# Patient Record
Sex: Male | Born: 1985 | Race: Black or African American | Hispanic: No | Marital: Single | Smoking: Current every day smoker
Health system: Southern US, Community
[De-identification: ages and names within clinical notes are randomized; demographics above are authoritative.]

## PROBLEM LIST (undated history)

## (undated) DIAGNOSIS — F1010 Alcohol abuse, uncomplicated: Secondary | ICD-10-CM

## (undated) HISTORY — PX: APPENDECTOMY: SHX54

---
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
## 2004-07-17 ENCOUNTER — Emergency Department (HOSPITAL_COMMUNITY): Admission: EM | Admit: 2004-07-17 | Discharge: 2004-07-17 | Payer: Self-pay | Admitting: Emergency Medicine

## 2004-07-27 ENCOUNTER — Emergency Department (HOSPITAL_COMMUNITY): Admission: EM | Admit: 2004-07-27 | Discharge: 2004-07-27 | Payer: Self-pay | Admitting: Family Medicine

## 2004-12-24 ENCOUNTER — Emergency Department (HOSPITAL_COMMUNITY): Admission: EM | Admit: 2004-12-24 | Discharge: 2004-12-24 | Payer: Self-pay | Admitting: Emergency Medicine

## 2005-01-03 ENCOUNTER — Emergency Department (HOSPITAL_COMMUNITY): Admission: EM | Admit: 2005-01-03 | Discharge: 2005-01-03 | Payer: Self-pay | Admitting: Emergency Medicine

## 2005-06-30 ENCOUNTER — Emergency Department (HOSPITAL_COMMUNITY): Admission: EM | Admit: 2005-06-30 | Discharge: 2005-06-30 | Payer: Self-pay | Admitting: Emergency Medicine

## 2006-01-10 ENCOUNTER — Emergency Department (HOSPITAL_COMMUNITY): Admission: EM | Admit: 2006-01-10 | Discharge: 2006-01-11 | Payer: Self-pay | Admitting: Emergency Medicine

## 2006-02-06 ENCOUNTER — Emergency Department (HOSPITAL_COMMUNITY): Admission: EM | Admit: 2006-02-06 | Discharge: 2006-02-07 | Payer: Self-pay | Admitting: Emergency Medicine

## 2006-02-12 ENCOUNTER — Emergency Department (HOSPITAL_COMMUNITY): Admission: EM | Admit: 2006-02-12 | Discharge: 2006-02-12 | Payer: Self-pay | Admitting: Emergency Medicine

## 2006-09-04 ENCOUNTER — Emergency Department (HOSPITAL_COMMUNITY): Admission: EM | Admit: 2006-09-04 | Discharge: 2006-09-04 | Payer: Self-pay | Admitting: Emergency Medicine

## 2010-02-04 ENCOUNTER — Emergency Department (HOSPITAL_COMMUNITY): Admission: EM | Admit: 2010-02-04 | Discharge: 2010-02-04 | Payer: Self-pay | Admitting: Emergency Medicine

## 2010-06-30 ENCOUNTER — Emergency Department (HOSPITAL_COMMUNITY): Admission: EM | Admit: 2010-06-30 | Discharge: 2010-06-30 | Payer: Self-pay | Admitting: Emergency Medicine

## 2010-06-30 ENCOUNTER — Emergency Department (HOSPITAL_COMMUNITY): Admission: EM | Admit: 2010-06-30 | Discharge: 2010-06-30 | Payer: Self-pay | Admitting: Family Medicine

## 2010-12-19 LAB — URINALYSIS, ROUTINE W REFLEX MICROSCOPIC
Bilirubin Urine: NEGATIVE
Nitrite: NEGATIVE
Protein, ur: NEGATIVE mg/dL
Urobilinogen, UA: 1 mg/dL (ref 0.0–1.0)

## 2010-12-19 LAB — URINE MICROSCOPIC-ADD ON

## 2011-02-04 ENCOUNTER — Emergency Department (HOSPITAL_COMMUNITY)
Admission: EM | Admit: 2011-02-04 | Discharge: 2011-02-04 | Payer: Self-pay | Attending: Emergency Medicine | Admitting: Emergency Medicine

## 2011-02-04 DIAGNOSIS — IMO0002 Reserved for concepts with insufficient information to code with codable children: Secondary | ICD-10-CM | POA: Insufficient documentation

## 2011-02-04 DIAGNOSIS — S0990XA Unspecified injury of head, initial encounter: Secondary | ICD-10-CM | POA: Insufficient documentation

## 2011-02-04 DIAGNOSIS — R404 Transient alteration of awareness: Secondary | ICD-10-CM | POA: Insufficient documentation

## 2011-02-04 DIAGNOSIS — H5710 Ocular pain, unspecified eye: Secondary | ICD-10-CM | POA: Insufficient documentation

## 2011-02-04 DIAGNOSIS — H5789 Other specified disorders of eye and adnexa: Secondary | ICD-10-CM | POA: Insufficient documentation

## 2011-02-04 DIAGNOSIS — F1010 Alcohol abuse, uncomplicated: Secondary | ICD-10-CM | POA: Insufficient documentation

## 2011-02-04 DIAGNOSIS — M549 Dorsalgia, unspecified: Secondary | ICD-10-CM | POA: Insufficient documentation

## 2011-02-04 DIAGNOSIS — T2104XA Burn of unspecified degree of lower back, initial encounter: Secondary | ICD-10-CM | POA: Insufficient documentation

## 2011-02-04 DIAGNOSIS — M79609 Pain in unspecified limb: Secondary | ICD-10-CM | POA: Insufficient documentation

## 2013-11-07 ENCOUNTER — Encounter (HOSPITAL_COMMUNITY): Payer: Self-pay | Admitting: Emergency Medicine

## 2013-11-07 ENCOUNTER — Emergency Department (HOSPITAL_COMMUNITY)
Admission: EM | Admit: 2013-11-07 | Discharge: 2013-11-07 | Discharge status: Against Medical Advice | Payer: Self-pay | Attending: Emergency Medicine | Admitting: Emergency Medicine

## 2013-11-07 DIAGNOSIS — R252 Cramp and spasm: Secondary | ICD-10-CM | POA: Insufficient documentation

## 2013-11-07 DIAGNOSIS — F1010 Alcohol abuse, uncomplicated: Secondary | ICD-10-CM | POA: Insufficient documentation

## 2013-11-07 DIAGNOSIS — F17200 Nicotine dependence, unspecified, uncomplicated: Secondary | ICD-10-CM | POA: Insufficient documentation

## 2013-11-07 DIAGNOSIS — Y939 Activity, unspecified: Secondary | ICD-10-CM | POA: Insufficient documentation

## 2013-11-07 DIAGNOSIS — IMO0002 Reserved for concepts with insufficient information to code with codable children: Secondary | ICD-10-CM

## 2013-11-07 DIAGNOSIS — F10929 Alcohol use, unspecified with intoxication, unspecified: Secondary | ICD-10-CM

## 2013-11-07 DIAGNOSIS — Y929 Unspecified place or not applicable: Secondary | ICD-10-CM | POA: Insufficient documentation

## 2013-11-07 DIAGNOSIS — T4271XA Poisoning by unspecified antiepileptic and sedative-hypnotic drugs, accidental (unintentional), initial encounter: Principal | ICD-10-CM

## 2013-11-07 DIAGNOSIS — T426X1A Poisoning by other antiepileptic and sedative-hypnotic drugs, accidental (unintentional), initial encounter: Secondary | ICD-10-CM | POA: Insufficient documentation

## 2013-11-07 LAB — CBC
HCT: 39.9 % (ref 39.0–52.0)
HEMOGLOBIN: 14.5 g/dL (ref 13.0–17.0)
MCH: 34.3 pg — ABNORMAL HIGH (ref 26.0–34.0)
MCHC: 36.3 g/dL — ABNORMAL HIGH (ref 30.0–36.0)
MCV: 94.3 fL (ref 78.0–100.0)
PLATELETS: 169 10*3/uL (ref 150–400)
RBC: 4.23 MIL/uL (ref 4.22–5.81)
RDW: 14.1 % (ref 11.5–15.5)
WBC: 5.2 10*3/uL (ref 4.0–10.5)

## 2013-11-07 LAB — URINALYSIS, ROUTINE W REFLEX MICROSCOPIC
BILIRUBIN URINE: NEGATIVE
Glucose, UA: NEGATIVE mg/dL
Hgb urine dipstick: NEGATIVE
KETONES UR: NEGATIVE mg/dL
Leukocytes, UA: NEGATIVE
NITRITE: NEGATIVE
Protein, ur: NEGATIVE mg/dL
SPECIFIC GRAVITY, URINE: 1.003 — AB (ref 1.005–1.030)
Urobilinogen, UA: 0.2 mg/dL (ref 0.0–1.0)
pH: 7 (ref 5.0–8.0)

## 2013-11-07 LAB — CK: Total CK: 769 U/L — ABNORMAL HIGH (ref 7–232)

## 2013-11-07 LAB — COMPREHENSIVE METABOLIC PANEL
ALBUMIN: 4.3 g/dL (ref 3.5–5.2)
ALK PHOS: 50 U/L (ref 39–117)
ALT: 20 U/L (ref 0–53)
AST: 41 U/L — AB (ref 0–37)
BILIRUBIN TOTAL: 0.2 mg/dL — AB (ref 0.3–1.2)
BUN: 6 mg/dL (ref 6–23)
CALCIUM: 9.1 mg/dL (ref 8.4–10.5)
CO2: 26 mEq/L (ref 19–32)
Chloride: 100 mEq/L (ref 96–112)
Creatinine, Ser: 0.88 mg/dL (ref 0.50–1.35)
GFR calc Af Amer: >90 mL/min (ref >90)
Glucose, Bld: 87 mg/dL (ref 70–99)
POTASSIUM: 4 meq/L (ref 3.7–5.3)
Sodium: 143 mEq/L (ref 137–147)
Total Protein: 7.9 g/dL (ref 6.0–8.3)

## 2013-11-07 LAB — RAPID URINE DRUG SCREEN, HOSP PERFORMED
AMPHETAMINES: NOT DETECTED
BARBITURATES: NOT DETECTED
BENZODIAZEPINES: NOT DETECTED
COCAINE: NOT DETECTED
Opiates: NOT DETECTED
TETRAHYDROCANNABINOL: NOT DETECTED

## 2013-11-07 LAB — ACETAMINOPHEN LEVEL

## 2013-11-07 LAB — ETHANOL: ALCOHOL ETHYL (B): 256 mg/dL — AB (ref 0–11)

## 2013-11-07 LAB — SALICYLATE LEVEL: Salicylate Lvl: <2 mg/dL — ABNORMAL LOW (ref 2.8–20.0)

## 2013-11-07 MED ORDER — SODIUM CHLORIDE 0.9 % IV BOLUS (SEPSIS)
1000.0000 mL | Freq: Once | INTRAVENOUS | Status: AC
  Administered 2013-11-07: 1000 mL via INTRAVENOUS

## 2013-11-07 MED ORDER — DIPHENHYDRAMINE HCL 50 MG/ML IJ SOLN
25.0000 mg | Freq: Once | INTRAMUSCULAR | Status: AC
  Administered 2013-11-07: 25 mg via INTRAVENOUS
  Filled 2013-11-07: qty 1

## 2013-11-07 NOTE — ED Notes (Signed)
Masneri went in to see pt and discharge pt, pt was no longer in the room. IV had not been disconnected. [facility]

## 2013-11-07 NOTE — ED Notes (Signed)
Per EMS- pt has been taking phenergan that he found on the street for 3 days (4 on Thursday, 5-6 Friday and 0 today) as well as 4 40oz beers a day since thursday and 1 today. Pt states that he did not know what the pill were for but decided to take them. Denies SI. Pt is having difficulty walking and using hands. States "my arms and legs are locking up on me". States "when i sit in a chair my neck leans back and locks up on me"
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
## 2013-11-07 NOTE — ED Provider Notes (Signed)
CSN: [id number]     Arrival date & time 11/07/13  1757 History   First MD Initiated Contact with Patient 11/07/13 1807     Chief Complaint  Patient presents with  . Drug Overdose   (Consider location/radiation/quality/duration/timing/severity/associated sxs/prior Treatment) HPI Comments: 28 yo AA male presents to ER via EMS with cc of muscle cramping.    Pt started drinking EtOH 3 days ago and was taking phenergan intermittently.  He found the phenergan on the street.  He threw them away after the symptoms began. Total of 4 on Thurs, 5-6 on Fri, and none today.  He states he is now cramping when he walks.    He denies MJ, Cocaine, heroin, or other Rx meds.  Just EtOH and phenergan.  "I just get my drink on and I took those pills".    Pt denies HA, CP, cough, fever, chills, n/v/d, f/u/d, neurologic deficits.    Patient is a 27 y.o. male presenting with Overdose. The history is provided by the patient and the EMS personnel.  Drug Overdose This is a new problem. The current episode started 2 days ago. The problem occurs constantly. The problem has been gradually improving. Pertinent negatives include no chest pain, no abdominal pain, no headaches and no shortness of breath. Nothing aggravates the symptoms. Nothing relieves the symptoms. He has tried nothing for the symptoms.    History reviewed. No pertinent past medical history. History reviewed. No pertinent past surgical history. History reviewed. No pertinent family history. History  Substance Use Topics  . Smoking status: Current Every Day Smoker -- 0.50 packs/day    Types: Cigarettes  . Smokeless tobacco: Not on file  . Alcohol Use: 1.8 oz/week    3 Cans of beer per week    Review of Systems  Constitutional: Negative.   HENT: Negative.   Eyes: Negative.   Respiratory: Negative.  Negative for cough, choking, chest tightness and shortness of breath.   Cardiovascular: Negative for chest pain.  Gastrointestinal: Negative.   Negative for nausea, abdominal pain, diarrhea, constipation, anal bleeding and rectal pain.  Endocrine: Negative.   Genitourinary: Negative.   Musculoskeletal: Positive for myalgias. Negative for arthralgias, back pain, gait problem, joint swelling, neck pain and neck stiffness.       Muscle cramping  Allergic/Immunologic: Negative.   Neurological: Negative for tremors, seizures, syncope, speech difficulty, light-headedness, numbness and headaches.  Psychiatric/Behavioral: Negative.     Allergies  Review of patient's allergies indicates no known allergies.  Home Medications  No current outpatient prescriptions on file. BP 131/77  Pulse 79  Temp(Src) 97.9 F (36.6 C) (Oral)  Resp 12  SpO2 98% Physical Exam  Nursing note and vitals reviewed. Constitutional: He is oriented to person, place, and time. He appears well-developed and well-nourished. No distress.  HENT:  Head: Normocephalic and atraumatic.  Nose: Nose normal.  Mouth/Throat: Oropharynx is clear and moist. No oropharyngeal exudate.  Eyes: Conjunctivae and EOM are normal. Right eye exhibits no discharge. Left eye exhibits no discharge.  Neck: Normal range of motion. Neck supple. No JVD present.  Cardiovascular: Normal rate and regular rhythm.  Exam reveals no friction rub.   No murmur heard. Pulmonary/Chest: Effort normal and breath sounds normal. No stridor. No respiratory distress. He has no wheezes. He has no rales. He exhibits no tenderness.  Abdominal: Soft. Bowel sounds are normal. There is no tenderness. There is no rebound and no guarding.  Musculoskeletal: Normal range of motion. He exhibits no edema and no tenderness.  Neurological: He is alert and oriented to person, place, and time. He has normal reflexes.  Skin: Skin is warm and dry. He is not diaphoretic.    ED Course  Procedures (including critical care time) Labs Review Labs Reviewed  CBC - Abnormal; Notable for the following:    MCH 34.3 (*)    MCHC  36.3 (*)    All other components within normal limits  COMPREHENSIVE METABOLIC PANEL - Abnormal; Notable for the following:    AST 41 (*)    Total Bilirubin 0.2 (*)    All other components within normal limits  CK - Abnormal; Notable for the following:    Total CK 769 (*)    All other components within normal limits  URINALYSIS, ROUTINE W REFLEX MICROSCOPIC - Abnormal; Notable for the following:    Specific Gravity, Urine 1.003 (*)    All other components within normal limits  SALICYLATE LEVEL - Abnormal; Notable for the following:    Salicylate Lvl <2.0 (*)    All other components within normal limits  ETHANOL - Abnormal; Notable for the following:    Alcohol, Ethyl (B) 256 (*)    All other components within normal limits  URINE RAPID DRUG SCREEN (HOSP PERFORMED)  ACETAMINOPHEN LEVEL   Imaging Review No results found.  EKG Interpretation    Date/Time:  Saturday November 07 2013 18:03:28 EST Ventricular Rate:  90 PR Interval:  145 QRS Duration: 99 QT Interval:  353 QTC Calculation: 432 R Axis:   -59 Text Interpretation:  Sinus rhythm Left axis deviation ST elev, probable normal early repol pattern Baseline wander in lead(s) V3 [facility] by [facility]  [facility], DAVID ([id number]) on 11/07/2013 6:08:49 PM            MDM   1. Drug ingestion   2. Alcohol intoxication    28 yo AA male presents with cc of muscle cramping after taking approx 9 tablets of phenergan 25mg  over past 2 days and drinking EtOH.    Plan for labwork, IVF hydration and monitoring.  Will give IV benadryl as well.    Prolonged ER stay. Patient feeling much better. No ectopy on the monitor. No issues during ER stay.  Patient did have an elevated CK at 769. He did receive IV fluids. His renal function and liver function tests were predominantly normal. UDS negative. Ethanol 256.  At approximately 2200 pt requesting to go home. Nursing informed him that I needed to come back and reassess him. He did ambulate  throughout ER without issues. He did have family members at the bedside. Currently patient left prior to formal discharge instructions. Presumably he took out his own IV. He was alert and oriented in no acute distress. Vital signs were stable. He was symptom free.  Darlys Galesavid Masneri, MD 11/07/13 2236

## 2014-05-09 ENCOUNTER — Encounter (HOSPITAL_COMMUNITY): Payer: Self-pay | Admitting: Emergency Medicine

## 2014-05-09 ENCOUNTER — Emergency Department (HOSPITAL_COMMUNITY)
Admission: EM | Admit: 2014-05-09 | Discharge: 2014-05-09 | Disposition: A | Discharge status: Home / Self Care | Payer: Self-pay | Attending: Emergency Medicine | Admitting: Emergency Medicine

## 2014-05-09 ENCOUNTER — Emergency Department (HOSPITAL_COMMUNITY): Payer: Self-pay

## 2014-05-09 DIAGNOSIS — S0990XA Unspecified injury of head, initial encounter: Secondary | ICD-10-CM | POA: Insufficient documentation

## 2014-05-09 DIAGNOSIS — R000 Tachycardia, unspecified: Secondary | ICD-10-CM | POA: Insufficient documentation

## 2014-05-09 DIAGNOSIS — T07XXXA Unspecified multiple injuries, initial encounter: Secondary | ICD-10-CM | POA: Insufficient documentation

## 2014-05-09 DIAGNOSIS — IMO0002 Reserved for concepts with insufficient information to code with codable children: Secondary | ICD-10-CM | POA: Insufficient documentation

## 2014-05-09 DIAGNOSIS — S022XXA Fracture of nasal bones, initial encounter for closed fracture: Secondary | ICD-10-CM | POA: Insufficient documentation

## 2014-05-09 DIAGNOSIS — F17200 Nicotine dependence, unspecified, uncomplicated: Secondary | ICD-10-CM | POA: Insufficient documentation

## 2014-05-09 LAB — BASIC METABOLIC PANEL
Anion gap: 29 — ABNORMAL HIGH (ref 5–15)
BUN: 8 mg/dL (ref 6–23)
CALCIUM: 9.2 mg/dL (ref 8.4–10.5)
CO2: 13 mEq/L — ABNORMAL LOW (ref 19–32)
CREATININE: 1.21 mg/dL (ref 0.50–1.35)
Chloride: 101 mEq/L (ref 96–112)
GFR calc non Af Amer: 80 mL/min — ABNORMAL LOW (ref >90)
Glucose, Bld: 91 mg/dL (ref 70–99)
Potassium: 3.9 mEq/L (ref 3.7–5.3)
Sodium: 143 mEq/L (ref 137–147)

## 2014-05-09 LAB — CBC
HCT: 46.8 % (ref 39.0–52.0)
Hemoglobin: 15.8 g/dL (ref 13.0–17.0)
MCH: 31.7 pg (ref 26.0–34.0)
MCHC: 33.8 g/dL (ref 30.0–36.0)
MCV: 94 fL (ref 78.0–100.0)
PLATELETS: 170 10*3/uL (ref 150–400)
RBC: 4.98 MIL/uL (ref 4.22–5.81)
RDW: 15.4 % (ref 11.5–15.5)
WBC: 12.1 10*3/uL — AB (ref 4.0–10.5)

## 2014-05-09 LAB — ETHANOL: Alcohol, Ethyl (B): 75 mg/dL — ABNORMAL HIGH (ref 0–11)

## 2014-05-09 MED ORDER — NAPROXEN 500 MG PO TABS: 500.0000 mg | ORAL_TABLET | Freq: Two times a day (BID) | ORAL | Status: DC

## 2014-05-09 MED ORDER — FENTANYL CITRATE 0.05 MG/ML IJ SOLN
100.0000 ug | Freq: Once | INTRAMUSCULAR | Status: AC
  Administered 2014-05-09: 100 ug via INTRAVENOUS
  Filled 2014-05-09: qty 2

## 2014-05-09 MED ORDER — SODIUM CHLORIDE 0.9 % IV BOLUS (SEPSIS)
1000.0000 mL | Freq: Once | INTRAVENOUS | Status: AC
  Administered 2014-05-09: 1000 mL via INTRAVENOUS

## 2014-05-09 MED ORDER — HYDROMORPHONE HCL PF 1 MG/ML IJ SOLN
1.0000 mg | Freq: Once | INTRAMUSCULAR | Status: AC
  Administered 2014-05-09: 1 mg via INTRAVENOUS
  Filled 2014-05-09: qty 1

## 2014-05-09 MED ORDER — SODIUM CHLORIDE 0.9 % IV BOLUS (SEPSIS): 1000.0000 mL | Freq: Once | INTRAVENOUS | Status: DC

## 2014-05-09 MED ORDER — HYDROCODONE-ACETAMINOPHEN 5-325 MG PO TABS
2.0000 | ORAL_TABLET | ORAL | Status: DC | PRN
Start: 2014-05-09 — End: 2014-08-11
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
## 2014-05-09 NOTE — Discharge Instructions (Signed)
°Emergency Department Resource Guide °1) Find a Doctor and Pay Out of Pocket °Although you won't have to find out who is covered by your insurance plan, it is a good idea to ask around and get recommendations. You will then need to call the office and see if the doctor you have chosen will accept you as a new patient and what types of options they offer for patients who are self-pay. Some doctors offer discounts or will set up payment plans for their patients who do not have insurance, but you will need to ask so you aren't surprised when you get to your appointment. ° °2) Contact Your Local Health Department °Not all health departments have doctors that can see patients for sick visits, but many do, so it is worth a call to see if yours does. If you don't know where your local health department is, you can check in your phone book. The CDC also has a tool to help you locate your state's health department, and many state websites also have listings of all of their local health departments. ° °3) Find a Walk-in Clinic °If your illness is not likely to be very severe or complicated, you may want to try a walk in clinic. These are popping up all over the country in pharmacies, drugstores, and shopping centers. They're usually staffed by nurse practitioners or physician assistants that have been trained to treat common illnesses and complaints. They're usually fairly quick and inexpensive. However, if you have serious medical issues or chronic medical problems, these are probably not your best option. ° °No Primary Care Doctor: °- Call [facility] at  832-8000 - they can help you locate a primary care doctor that  accepts your insurance, provides certain services, etc. °- Physician Referral Service- 1-800-533-3463 ° °Chronic Pain Problems: °Organization         Address  Phone   Notes  °[facility]  (336) 297-2271 Patients need to be referred by their primary care doctor.  ° °Medication  Assistance: °Organization         Address  Phone   Notes  °[place] [facility] [address] °[place], [place] [address] (336) 641-8030 --Must be a resident of [place] °-- Must have NO insurance coverage whatsoever (no Medicaid/ Medicare, etc.) °-- The pt. MUST have a primary care doctor that directs their care regularly and follows them in the community °  °[facility]  (866) 331-1348   °[employer]  (888) 892-1162   ° °Agencies that provide inexpensive medical care: °Organization         Address  Phone   Notes  °[facility]  (336) 832-8035   °[facility]    (336) 832-7272   °[facility] [address] °[place], [place] [address] (336) 832-4777   °[facility] [address], °[place] (336) 271-4999   °[facility]    (336) 373-0678   °[facility]    (336) 272-1050   °[facility] ° [address], [place] Phone:  (336) 832-4444, Fax:  (336) 832-4440 Hours of Operation:  9 am - 6 pm, M-F.  Also accepts Medicaid/Medicare and self-pay.  °[facility] ° [address], [place] Phone: (336) 832-3150, Fax: (336) 832-3151. Hours of Operation:  8:30 am - 5:30 pm, M-F.  Also accepts Medicaid and self-pay.  °[facility] [place] [address], [place] Phone: (336) 878-6027   °[facility] [address], [place], [place] (336)723-1848, Ext. 123 Mondays & Thursdays: 7-9 AM.  First 15 patients are seen on a first come, first serve basis. °  ° °Medicaid-accepting [place] Providers: ° °Organization         Address  Phone   Notes  °[facility] [address], [place] (336) 641-2100 Also accepts self-pay patients.  °[facility] [address], [place] ° (336) 856-9996   °[facility] [address], [place]  (336) 288-8857   °[facility] [address] [place] [address], [place] (336) 299-7000   °Veita Bland [address], [place]  ° (336) 373-1557 Only accepts [place] Access Medicaid patients after they have their name applied to their card.  ° °Self-Pay (no insurance) in [place]: ° °Organization         Address  Phone   Notes  °Sickle Cell Patients, [facility] [address], [place] (336) 832-1970   °[facility] [address], [place] (336) 832-4400   °[facility] [place] ° [address], [place] (336) 992-4800   °[facility]/Dr. Osei-Bonsu ° [address] [place] [address], [place] or [address], [place] (336) 841-8500 Phone number for both [place] and [place] locations is the same.  °[facility] [address], [place] (336) 299-0000   °[facility] [address] [place] [address], [place] or [address] (336) 852-7530 °(336) 878-2260   °[facility] [address], [place] (336) 350-1642, phone; (336) 294-5005, fax Sees patients 1st and 3rd Saturday of every month.  Must not qualify for public or private insurance (i.e. Medicaid, Medicare, [place] Health Choice, Veterans' Benefits) • Household income should be no more than 200% of the poverty level •The clinic cannot treat you if you are pregnant or think you are pregnant • Sexually transmitted diseases are not treated at the clinic.  ° ° °Dental Care: °Organization         Address  Phone  Notes  °[place] [facility] [address], [place] (336) 641-6152 Accepts children up to age 21 who are enrolled in Medicaid or [place] Health Choice; pregnant women with a Medicaid card; and children who have applied for Medicaid or [place] Health Choice, but were declined, whose parents can pay a reduced fee at time of service.  °[place]  [facility] [place]  [address], [place] (336) 641-7733 Accepts children up to age 21 who are enrolled in Medicaid or [place] Health Choice; pregnant women with a Medicaid card; and children who have applied for Medicaid or [place] Health Choice, but were declined, whose parents can pay a reduced fee at time of service.  °[facility] ° [address],  (336) 641-4533 Patients are seen by appointment only. Walk-ins are not accepted. [facility] will see patients 18 years of age and older. °Monday - Tuesday (8am-5pm) °Most Wednesdays (8:30-5pm) °$30 per visit, cash only  °[facility] ° [address], [place] (336) 641-4533 Patients are seen by appointment only. Walk-ins are not accepted. [facility] will see patients 18 years of age and older. °One   Wednesday Evening (Monthly: Volunteer Based).  $30 per visit, cash only  °[facility]  (919) 537-3737 for adults; Children under age 4, call Graduate Pediatric Dentistry at (919) [phone]. Children aged 4-14, please call (919) [phone] to request a pediatric application. ° Dental services are provided in all areas of dental care including fillings, crowns and bridges, complete and partial dentures, implants, gum treatment, root canals, and extractions. Preventive care is also provided. Treatment is provided to both adults and children. °Patients are selected via a lottery and there is often a waiting list. °  °[facility] [address], °[place] ° (336) 763-8833 [email or web address] °  °[facility] [address], [place], [place] (336)723-1848, Ext. 123 Second and Fourth Thursday of each month, opens at 6:30 AM; Clinic ends at 9 AM.  Patients are seen on a first-come first-served basis, and a limited number are seen during each clinic.  ° °[facility] ° [address], [place], [place] (336) 723-7904    Eligibility Requirements °You must have lived in [place], [place], or [place] counties for at least the last three months. °  You cannot be eligible for state or federal sponsored healthcare insurance, including Veterans Administration, Medicaid, or Medicare. °  You generally cannot be eligible for healthcare insurance through your employer.  °  How to apply: °Eligibility screenings are held every Tuesday and Wednesday afternoon from 1:00 pm until 4:00 pm. You do not need an appointment for the interview!  °[facility] [address], [place], [place] 336-631-2330   °[facility] [facility]  336-342-8273   °[facility]  336-703-3100   °[facility]  336-570-6415   ° °Behavioral Health Resources in the Community: °Intensive Outpatient Programs °Organization         Address  Phone  Notes  °[facility] [address], [place], [place] 336-878-6098   °[facility] [address], [place], [place] 336-832-9800   °[facility] [address], [place], [place] ° 336-882-2125   °[facility] [address],  °[place], [place] 1-800-853-5163 or 336-641-4981   °Substance Abuse Resources °Organization         Address  Phone  Notes  °[facility]  336-882-2125   °[facility]  336-784-9470   °[facility]  336-285-9073   °[facility]  336-845-3988   °Residential & Outpatient Substance Abuse Program  1-800-659-3381   °Psychological Services °Organization         Address  Phone  Notes  °[facility]  336- 832-9600   °[facility]  336- 378-7881   °[facility] [address], [place] 1-800-853-5163 or 336-641-4981   ° °Mobile Crisis Teams °Organization         Address  Phone  Notes  °[facility], Mobile Crisis Care Unit  1-877-626-1772   °Assertive °[facility] ° [address]  [place], [place] 336-834-9664   °Sharon DeEsch [address] °[place] 336-554-5454   ° °Self-Help/Support Groups °Organization         Address  Phone             Notes  °[facility]  - variety of support groups  336- 373-1402 Call for more information  °Narcotics Anonymous (NA), [facility] [address], °[place]  2 meetings at this location  ° °  Residential Treatment Programs °Organization         Address  Phone  Notes  °[facility] [address],    °[place]  1-866-801-8205   °[facility] ° [address], [place], [place] 704-293-8524   °[facility] [address], [place] 336-845-3988 Admissions: 8am-3pm M-F  °[facility] [address]    °[place], [place] 336-841-1104   °[facility] [address], [place], [place] 336-379-7146   °[facility] [address]  °[place], [place] 336-285-9073   °[facility] - Intensive Outpatient [address], [place], [place] 336-852-3033   °[facility] [address]  °[place], [place] 1-877-615-2722 or 336-784-9470   °[facility] [address] [place], [place] 336-227-7417 Accepts Medicaid  °[facility] [address]  °[place] 1-800-659-3381 Substance Abuse/Addiction Treatment  ° °[place] Behavioral Health Resources °Organization         Address  Phone  Notes  °[facility]  (888) 581-9988   °Julie Brannon, PhD [address] [place], [place]   (336) 349-5553 or (336) 951-0000   °[facility]   [address] °[place], [place] (336) 349-4454   °[facility] [address], [place], [place] (336) 342-8316 Insurance/Medicaid/sponsorship through [facility]  °[facility] [address]                                    [place], [place] (336) 342-8316 Therapy/tele-psych/case    °[facility] [address]  ° [place], [place] (336) 349-2233    °Dr. Arfeen  (336) 349-4544   °[facility] [place]  [employer] [place] [facility] 1) [address], [place] °2) [address], [place] °3)  [address], [place] (336) 349-3220 °(336) 342-7768 ° °(336) 342-8140   °[place] [facility] (336) 342-1394 or (336) 342-3537 (After Hours)    ° ° °

## 2014-08-11 ENCOUNTER — Encounter (HOSPITAL_COMMUNITY): Payer: Self-pay | Admitting: Emergency Medicine

## 2014-08-11 ENCOUNTER — Emergency Department (HOSPITAL_COMMUNITY)
Admission: EM | Admit: 2014-08-11 | Discharge: 2014-08-11 | Disposition: A | Discharge status: Home / Self Care | Payer: Self-pay | Attending: Emergency Medicine | Admitting: Emergency Medicine

## 2014-08-11 DIAGNOSIS — K029 Dental caries, unspecified: Secondary | ICD-10-CM | POA: Insufficient documentation

## 2014-08-11 DIAGNOSIS — K088 Other specified disorders of teeth and supporting structures: Secondary | ICD-10-CM | POA: Insufficient documentation

## 2014-08-11 DIAGNOSIS — Z720 Tobacco use: Secondary | ICD-10-CM | POA: Insufficient documentation

## 2014-08-11 DIAGNOSIS — K0889 Other specified disorders of teeth and supporting structures: Secondary | ICD-10-CM

## 2014-08-11 MED ORDER — NAPROXEN 500 MG PO TABS: 500.0000 mg | ORAL_TABLET | Freq: Two times a day (BID) | ORAL | Status: DC

## 2014-08-11 MED ORDER — HYDROCODONE-ACETAMINOPHEN 5-325 MG PO TABS: 2.0000 | ORAL_TABLET | ORAL | Status: DC | PRN

## 2014-08-11 MED ORDER — KETOROLAC TROMETHAMINE 60 MG/2ML IM SOLN
60.0000 mg | Freq: Once | INTRAMUSCULAR | Status: AC
  Administered 2014-08-11: 60 mg via INTRAMUSCULAR
  Filled 2014-08-11: qty 2

## 2014-08-11 MED ORDER — PENICILLIN V POTASSIUM 250 MG PO TABS: 500.0000 mg | ORAL_TABLET | Freq: Four times a day (QID) | ORAL | Status: DC

## 2014-08-11 NOTE — Discharge Instructions (Signed)

## 2014-08-11 NOTE — ED Notes (Signed)
Pt. reports left upper and lower molar pain for 2 weeks unrelieved by OTC medications .

## 2014-08-11 NOTE — ED Provider Notes (Signed)
CSN: [id number]     Arrival date & time 08/11/14  2118 History   First MD Initiated Contact with Patient 08/11/14 2129     No chief complaint on file.    (Consider location/radiation/quality/duration/timing/severity/associated sxs/prior Treatment) HPI Comments: Dental pain for sevferal months, worse last 2 weeks, no associated swelling or cp or f/cn/v.  OTC meds without relief.  Pain in L lower and upper rear molars.  The history is provided by the patient.    History reviewed. No pertinent past medical history. Past Surgical History  Procedure Laterality Date  . Appendectomy     No family history on file. History  Substance Use Topics  . Smoking status: Current Every Day Smoker -- 0.50 packs/day    Types: Cigarettes  . Smokeless tobacco: Not on file  . Alcohol Use: 1.8 oz/week    3 Cans of beer per week    Review of Systems  Constitutional: Negative for fever and chills.  HENT: Positive for dental problem. Negative for facial swelling, sore throat, trouble swallowing and voice change.        Toothache  Gastrointestinal: Negative for nausea and vomiting.      Allergies  Review of patient's allergies indicates no known allergies.  Home Medications   Prior to Admission medications   Medication Sig Start Date End Date Taking? Authorizing Provider  HYDROcodone-acetaminophen (NORCO/VICODIN) 5-325 MG per tablet Take 2 tablets by mouth every 4 (four) hours as needed. 08/11/14   Vida RollerBrian D Dicky Boer, MD  naproxen (NAPROSYN) 500 MG tablet Take 1 tablet (500 mg total) by mouth 2 (two) times daily with a meal. 08/11/14   Vida RollerBrian D Amore Grater, MD  penicillin v potassium (VEETID) 250 MG tablet Take 2 tablets (500 mg total) by mouth 4 (four) times daily. 08/11/14   Vida RollerBrian D Izaiah Tabb, MD   BP 128/80 mmHg  Pulse 91  Temp(Src) 97.8 F (36.6 C) (Oral)  Resp 16  Ht 6\' 1"  (1.854 m)  Wt 185 lb (83.915 kg)  BMI 24.41 kg/m2  SpO2 97% Physical Exam  Constitutional: He appears well-developed and  well-nourished. No distress.  HENT:  Head: Normocephalic and atraumatic.  Mouth/Throat: Oropharynx is clear and moist. No oropharyngeal exudate.  Dental Disease - rear upper and lower L molars with deep caries, no abscesses, no trismus  Eyes: Conjunctivae are normal. No scleral icterus.  Neck: Normal range of motion. Neck supple. No thyromegaly present.  No lad  Cardiovascular: Normal rate and regular rhythm.   Pulmonary/Chest: Effort normal and breath sounds normal.  Lymphadenopathy:    He has no cervical adenopathy.  Neurological: He is alert.  Skin: Skin is warm and dry. No rash noted. He is not diaphoretic.  Nursing note and vitals reviewed.   ED Course  Procedures (including critical care time) Labs Review Labs Reviewed - No data to display  Imaging Review No results found.    MDM   Final diagnoses:  Toothache    WELL APPEARING, NO LUDWIGS, DENTAL F/U  Meds given in ED:  Medications  ketorolac (TORADOL) injection 60 mg (not administered)    New Prescriptions   HYDROCODONE-ACETAMINOPHEN (NORCO/VICODIN) 5-325 MG PER TABLET    Take 2 tablets by mouth every 4 (four) hours as needed.   NAPROXEN (NAPROSYN) 500 MG TABLET    Take 1 tablet (500 mg total) by mouth 2 (two) times daily with a meal.   PENICILLIN V POTASSIUM (VEETID) 250 MG TABLET    Take 2 tablets (500 mg total) by mouth 4 (  four) times daily.        Vida RollerBrian D Jacara Benito, MD 08/11/14 [phone]

## 2015-02-23 ENCOUNTER — Encounter (HOSPITAL_COMMUNITY): Payer: Self-pay | Admitting: *Deleted

## 2015-02-23 DIAGNOSIS — F17210 Nicotine dependence, cigarettes, uncomplicated: Secondary | ICD-10-CM | POA: Diagnosis present

## 2015-02-23 DIAGNOSIS — Z833 Family history of diabetes mellitus: Secondary | ICD-10-CM

## 2015-02-23 DIAGNOSIS — K852 Alcohol induced acute pancreatitis: Principal | ICD-10-CM | POA: Diagnosis present

## 2015-02-23 DIAGNOSIS — F1010 Alcohol abuse, uncomplicated: Secondary | ICD-10-CM | POA: Diagnosis present

## 2015-02-23 DIAGNOSIS — R000 Tachycardia, unspecified: Secondary | ICD-10-CM | POA: Diagnosis present

## 2015-02-23 LAB — CBC WITH DIFFERENTIAL/PLATELET
BASOS ABS: 0 10*3/uL (ref 0.0–0.1)
Basophils Relative: 0 % (ref 0–1)
EOS ABS: 0 10*3/uL (ref 0.0–0.7)
Eosinophils Relative: 0 % (ref 0–5)
HEMATOCRIT: 42.4 % (ref 39.0–52.0)
Hemoglobin: 14.9 g/dL (ref 13.0–17.0)
LYMPHS PCT: 11 % — AB (ref 12–46)
Lymphs Abs: 1 10*3/uL (ref 0.7–4.0)
MCH: 33.9 pg (ref 26.0–34.0)
MCHC: 35.1 g/dL (ref 30.0–36.0)
MCV: 96.4 fL (ref 78.0–100.0)
MONO ABS: 0.7 10*3/uL (ref 0.1–1.0)
Monocytes Relative: 8 % (ref 3–12)
Neutro Abs: 7.3 10*3/uL (ref 1.7–7.7)
Neutrophils Relative %: 81 % — ABNORMAL HIGH (ref 43–77)
Platelets: 190 10*3/uL (ref 150–400)
RBC: 4.4 MIL/uL (ref 4.22–5.81)
RDW: 14.4 % (ref 11.5–15.5)
WBC: 9 10*3/uL (ref 4.0–10.5)

## 2015-02-23 MED ORDER — ONDANSETRON 8 MG PO TBDP
8.0000 mg | ORAL_TABLET | Freq: Once | ORAL | Status: AC
  Administered 2015-02-23: 8 mg via ORAL
  Filled 2015-02-23: qty 1

## 2015-02-23 NOTE — ED Notes (Signed)
Pt arrives to the ER via EMS for complaints of N/V and abd pain; pt states that he drank ETOH excessively yesterday; pt states that he has vomiting today approx 15 times; pt reports diarrhea off and in x 1 week; pt reports 3 episodes of diarrhea today; tp c/o generalized abd pain and tenderness upon palpation

## 2015-02-24 ENCOUNTER — Encounter (HOSPITAL_COMMUNITY): Payer: Self-pay | Admitting: Internal Medicine

## 2015-02-24 ENCOUNTER — Inpatient Hospital Stay (HOSPITAL_COMMUNITY)
Admission: EM | Admit: 2015-02-24 | Discharge: 2015-02-26 | DRG: 440 | Disposition: A | Discharge status: Home / Self Care | Payer: Self-pay | Attending: Internal Medicine | Admitting: Internal Medicine

## 2015-02-24 DIAGNOSIS — R1013 Epigastric pain: Secondary | ICD-10-CM

## 2015-02-24 DIAGNOSIS — K852 Alcohol induced acute pancreatitis without necrosis or infection: Secondary | ICD-10-CM | POA: Diagnosis present

## 2015-02-24 DIAGNOSIS — Z789 Other specified health status: Secondary | ICD-10-CM | POA: Diagnosis present

## 2015-02-24 DIAGNOSIS — Z7289 Other problems related to lifestyle: Secondary | ICD-10-CM | POA: Diagnosis present

## 2015-02-24 LAB — COMPREHENSIVE METABOLIC PANEL
ALK PHOS: 54 U/L (ref 38–126)
ALK PHOS: 46 U/L (ref 38–126)
ALT: 17 U/L (ref 17–63)
ALT: 18 U/L (ref 17–63)
ANION GAP: 12 (ref 5–15)
ANION GAP: 10 (ref 5–15)
AST: 39 U/L (ref 15–41)
AST: 34 U/L (ref 15–41)
Albumin: 4.8 g/dL (ref 3.5–5.0)
Albumin: 4 g/dL (ref 3.5–5.0)
BUN: 9 mg/dL (ref 6–20)
BUN: 8 mg/dL (ref 6–20)
CHLORIDE: 102 mmol/L (ref 101–111)
CO2: 27 mmol/L (ref 22–32)
CO2: 25 mmol/L (ref 22–32)
CREATININE: 0.82 mg/dL (ref 0.61–1.24)
Calcium: 9.6 mg/dL (ref 8.9–10.3)
Calcium: 8.7 mg/dL — ABNORMAL LOW (ref 8.9–10.3)
Chloride: 99 mmol/L — ABNORMAL LOW (ref 101–111)
Creatinine, Ser: 0.71 mg/dL (ref 0.61–1.24)
GFR calc Af Amer: >60 mL/min (ref >60)
GFR calc Af Amer: >60 mL/min (ref >60)
GLUCOSE: 113 mg/dL — AB (ref 65–99)
GLUCOSE: 99 mg/dL (ref 65–99)
POTASSIUM: 3.5 mmol/L (ref 3.5–5.1)
Potassium: 4 mmol/L (ref 3.5–5.1)
SODIUM: 138 mmol/L (ref 135–145)
Sodium: 137 mmol/L (ref 135–145)
Total Bilirubin: 1 mg/dL (ref 0.3–1.2)
Total Bilirubin: 1 mg/dL (ref 0.3–1.2)
Total Protein: 8.1 g/dL (ref 6.5–8.1)
Total Protein: 7.1 g/dL (ref 6.5–8.1)

## 2015-02-24 LAB — URINALYSIS, ROUTINE W REFLEX MICROSCOPIC
Bilirubin Urine: NEGATIVE
GLUCOSE, UA: NEGATIVE mg/dL
Hgb urine dipstick: NEGATIVE
Ketones, ur: 40 mg/dL — AB
Leukocytes, UA: NEGATIVE
Nitrite: NEGATIVE
PROTEIN: NEGATIVE mg/dL
SPECIFIC GRAVITY, URINE: 1.022 (ref 1.005–1.030)
UROBILINOGEN UA: 1 mg/dL (ref 0.0–1.0)
pH: 6 (ref 5.0–8.0)

## 2015-02-24 LAB — CBC WITH DIFFERENTIAL/PLATELET
Basophils Absolute: 0 10*3/uL (ref 0.0–0.1)
Basophils Relative: 0 % (ref 0–1)
EOS ABS: 0 10*3/uL (ref 0.0–0.7)
EOS PCT: 0 % (ref 0–5)
HCT: 41.2 % (ref 39.0–52.0)
Hemoglobin: 14.2 g/dL (ref 13.0–17.0)
LYMPHS ABS: 0.8 10*3/uL (ref 0.7–4.0)
Lymphocytes Relative: 8 % — ABNORMAL LOW (ref 12–46)
MCH: 33.6 pg (ref 26.0–34.0)
MCHC: 34.5 g/dL (ref 30.0–36.0)
MCV: 97.4 fL (ref 78.0–100.0)
MONO ABS: 0.8 10*3/uL (ref 0.1–1.0)
MONOS PCT: 7 % (ref 3–12)
NEUTROS PCT: 85 % — AB (ref 43–77)
Neutro Abs: 8.9 10*3/uL — ABNORMAL HIGH (ref 1.7–7.7)
Platelets: 163 10*3/uL (ref 150–400)
RBC: 4.23 MIL/uL (ref 4.22–5.81)
RDW: 14.6 % (ref 11.5–15.5)
WBC: 10.5 10*3/uL (ref 4.0–10.5)

## 2015-02-24 LAB — LACTIC ACID, PLASMA: Lactic Acid, Venous: 1.3 mmol/L (ref 0.5–2.0)

## 2015-02-24 LAB — ETHANOL

## 2015-02-24 LAB — LIPASE, BLOOD: Lipase: 1153 U/L — ABNORMAL HIGH (ref 22–51)

## 2015-02-24 LAB — TRIGLYCERIDES: Triglycerides: 77 mg/dL (ref <150)

## 2015-02-24 MED ORDER — PANTOPRAZOLE SODIUM 40 MG IV SOLR
40.0000 mg | INTRAVENOUS | Status: DC
  Filled 2015-02-24: qty 40

## 2015-02-24 MED ORDER — MORPHINE SULFATE 2 MG/ML IJ SOLN
2.0000 mg | INTRAMUSCULAR | Status: DC | PRN
  Administered 2015-02-24 (×5): 2 mg via INTRAVENOUS
  Filled 2015-02-24 (×5): qty 1

## 2015-02-24 MED ORDER — LORAZEPAM 2 MG/ML IJ SOLN
1.0000 mg | Freq: Four times a day (QID) | INTRAMUSCULAR | Status: DC | PRN
  Administered 2015-02-24 – 2015-02-25 (×3): 1 mg via INTRAVENOUS
  Filled 2015-02-24 (×3): qty 1

## 2015-02-24 MED ORDER — SODIUM CHLORIDE 0.9 % IV SOLN: INTRAVENOUS | Status: DC

## 2015-02-24 MED ORDER — FOLIC ACID 1 MG PO TABS
1.0000 mg | ORAL_TABLET | Freq: Every day | ORAL | Status: DC
  Administered 2015-02-24 – 2015-02-26 (×3): 1 mg via ORAL
  Filled 2015-02-24 (×3): qty 1

## 2015-02-24 MED ORDER — SODIUM CHLORIDE 0.9 % IV SOLN
1000.0000 mL | Freq: Once | INTRAVENOUS | Status: AC
  Administered 2015-02-24: 1000 mL via INTRAVENOUS

## 2015-02-24 MED ORDER — VITAMIN B-1 100 MG PO TABS
100.0000 mg | ORAL_TABLET | Freq: Every day | ORAL | Status: DC
  Administered 2015-02-24 – 2015-02-26 (×3): 100 mg via ORAL
  Filled 2015-02-24 (×3): qty 1

## 2015-02-24 MED ORDER — HYDROMORPHONE HCL 1 MG/ML IJ SOLN
0.5000 mg | Freq: Once | INTRAMUSCULAR | Status: AC
  Administered 2015-02-24: 0.5 mg via INTRAVENOUS
  Filled 2015-02-24: qty 1

## 2015-02-24 MED ORDER — PNEUMOCOCCAL VAC POLYVALENT 25 MCG/0.5ML IJ INJ
0.5000 mL | INJECTION | INTRAMUSCULAR | Status: AC
  Administered 2015-02-26: 0.5 mL via INTRAMUSCULAR
  Filled 2015-02-24 (×3): qty 0.5

## 2015-02-24 MED ORDER — ONDANSETRON HCL 4 MG/2ML IJ SOLN
4.0000 mg | Freq: Four times a day (QID) | INTRAMUSCULAR | Status: DC | PRN
  Administered 2015-02-24 – 2015-02-26 (×5): 4 mg via INTRAVENOUS
  Filled 2015-02-24 (×5): qty 2

## 2015-02-24 MED ORDER — MORPHINE SULFATE 2 MG/ML IJ SOLN
2.0000 mg | INTRAMUSCULAR | Status: DC | PRN
  Administered 2015-02-24 – 2015-02-25 (×4): 2 mg via INTRAVENOUS
  Filled 2015-02-24 (×4): qty 1

## 2015-02-24 MED ORDER — METOCLOPRAMIDE HCL 5 MG/ML IJ SOLN
10.0000 mg | Freq: Once | INTRAMUSCULAR | Status: AC
  Administered 2015-02-24: 10 mg via INTRAVENOUS
  Filled 2015-02-24: qty 2

## 2015-02-24 MED ORDER — SODIUM CHLORIDE 0.9 % IV SOLN
INTRAVENOUS | Status: DC
  Administered 2015-02-24 – 2015-02-26 (×3): via INTRAVENOUS

## 2015-02-24 MED ORDER — SODIUM CHLORIDE 0.9 % IV SOLN: 1000.0000 mL | INTRAVENOUS | Status: DC

## 2015-02-24 MED ORDER — ACETAMINOPHEN 325 MG PO TABS: 650.0000 mg | ORAL_TABLET | Freq: Four times a day (QID) | ORAL | Status: DC | PRN

## 2015-02-24 MED ORDER — ONDANSETRON HCL 4 MG PO TABS
4.0000 mg | ORAL_TABLET | Freq: Four times a day (QID) | ORAL | Status: DC | PRN
  Administered 2015-02-25 – 2015-02-26 (×2): 4 mg via ORAL
  Filled 2015-02-24 (×3): qty 1

## 2015-02-24 MED ORDER — SODIUM CHLORIDE 0.9 % IJ SOLN
3.0000 mL | Freq: Two times a day (BID) | INTRAMUSCULAR | Status: DC
  Administered 2015-02-24 – 2015-02-25 (×2): 3 mL via INTRAVENOUS

## 2015-02-24 MED ORDER — LORAZEPAM 1 MG PO TABS
1.0000 mg | ORAL_TABLET | Freq: Four times a day (QID) | ORAL | Status: DC | PRN
  Administered 2015-02-25 – 2015-02-26 (×3): 1 mg via ORAL
  Filled 2015-02-24 (×3): qty 1

## 2015-02-24 MED ORDER — ACETAMINOPHEN 650 MG RE SUPP: 650.0000 mg | Freq: Four times a day (QID) | RECTAL | Status: DC | PRN

## 2015-02-24 MED ORDER — PANTOPRAZOLE SODIUM 40 MG IV SOLR
40.0000 mg | INTRAVENOUS | Status: DC
  Administered 2015-02-24 – 2015-02-26 (×3): 40 mg via INTRAVENOUS
  Filled 2015-02-24 (×3): qty 40

## 2015-02-24 MED ORDER — THIAMINE HCL 100 MG/ML IJ SOLN
100.0000 mg | Freq: Every day | INTRAMUSCULAR | Status: DC
  Filled 2015-02-24 (×3): qty 1

## 2015-02-24 MED ORDER — ENOXAPARIN SODIUM 40 MG/0.4ML ~~LOC~~ SOLN
40.0000 mg | SUBCUTANEOUS | Status: DC
  Administered 2015-02-24 – 2015-02-26 (×3): 40 mg via SUBCUTANEOUS
  Filled 2015-02-24 (×3): qty 0.4

## 2015-02-24 MED ORDER — ADULT MULTIVITAMIN W/MINERALS CH
1.0000 | ORAL_TABLET | Freq: Every day | ORAL | Status: DC
  Administered 2015-02-24 – 2015-02-26 (×3): 1 via ORAL
  Filled 2015-02-24 (×3): qty 1

## 2015-02-24 NOTE — H&P (Deleted)
[facility] History and Physical  Patient: Isaac Daniel Kihn  MRN: [id number]  DOB: 11/10/1985  DOS: the patient was seen and examined on 02/24/2015 PCP: No PCP Per Patient  Referring physician: Elpidio AnisShari Upstill, PA-C Chief Complaint: Abdominal pain  HPI: Isaac Daniel Cronin is a 29 y.o. male with Past medical history of alcohol use. Patient presents with complaints of abdominal pain ongoing since last 2 days. He mentions that he had one week of diarrhea with nausea and vomiting. The diarrhea has been watery loose without any blood. The diarrhea has improved but since last 2 days he had increasing episodes of nausea as well as abdominal pain which is located in the upper abdomen. He mentions he drinks 2-3 cans of beer 40 ounce on a daily basis and since last few days he has also been using liqueur along with that. He denies any fever, basal some chills. Denies any chest pain or shortness of breath. Comparison of acid reflux. He does not have any burning urination. Denies any drugs denies any prescription medication use.  The patient is coming from home. And at his baseline independent for most of his ADL.  Review of Systems: as mentioned in the history of present illness.  A comprehensive review of the other systems is negative.  History reviewed. No pertinent past medical history. Past Surgical History  Procedure Laterality Date  . Appendectomy     Social History:  reports that he has been smoking Cigarettes.  He has been smoking about 0.50 packs per day. He does not have any smokeless tobacco history on file. He reports that he drinks about 1.8 oz of alcohol per week. He reports that he uses illicit drugs (Marijuana).  No Known Allergies  Family History  Problem Relation Age of Onset  . Diabetes Maternal Grandmother   . Diabetes Paternal Grandmother     Prior to Admission medications   Not on File    Physical Exam: Filed Vitals:   02/24/15 0130 02/24/15 0200 02/24/15 0305  02/24/15 0412  BP: 156/99 146/97 139/91 144/93  Pulse: 29 108 65 63  Temp:    97.7 F (36.5 C)  TempSrc:    Oral  Resp:   18 18  Height:    6\' 1"  (1.854 m)  Weight:    67.359 kg (148 lb 8 oz)  SpO2: 99% 96% 100% 99%    General: Alert, Awake and Oriented to Time, Place and Person. Appear in mild distress Eyes: PERRL ENT: Oral Mucosa clear moist. Neck: no JVD Cardiovascular: S1 and S2 Present, no Murmur, Peripheral Pulses Present Respiratory: Bilateral Air entry equal and Decreased,  Clear to Auscultation, no Crackles, no wheezes Abdomen: Bowel Sound  Present, Soft and upper abdomen tender Skin: no Rash Extremities: no Pedal edema, no calf tenderness Neurologic: Grossly no focal neuro deficit. Labs on Admission:  CBC:  Recent Labs Lab 02/23/15 2304 02/24/15 0259  WBC 9.0 10.5  NEUTROABS 7.3 8.9*  HGB 14.9 14.2  HCT 42.4 41.2  MCV 96.4 97.4  PLT 190 163    CMP     Component Value Date/Time   NA 137 02/24/2015 0259   K 4.0 02/24/2015 0259   CL 102 02/24/2015 0259   CO2 25 02/24/2015 0259   GLUCOSE 99 02/24/2015 0259   BUN 8 02/24/2015 0259   CREATININE 0.71 02/24/2015 0259   CALCIUM 8.7* 02/24/2015 0259   PROT 7.1 02/24/2015 0259   ALBUMIN 4.0 02/24/2015 0259   AST 34 02/24/2015 0259   ALT 18  02/24/2015 0259   ALKPHOS 46 02/24/2015 0259   BILITOT 1.0 02/24/2015 0259   GFRNONAA >60 02/24/2015 0259   GFRAA >60 02/24/2015 0259     Recent Labs Lab 02/23/15 2304  LIPASE 1153*    No results for input(s): CKTOTAL, CKMB, CKMBINDEX, TROPONINI in the last 168 hours. BNP (last 3 results) No results for input(s): BNP in the last 8760 hours.  ProBNP (last 3 results) No results for input(s): PROBNP in the last 8760 hours.   Radiological Exams on Admission: No results found. Assessment/Plan Principal Problem:   Pancreatitis, alcoholic, acute Active Problems:   Alcohol use   1. Pancreatitis, alcoholic, acute The patient is presenting with numbness of  abdominal pain. Located in the epigastric region. The patient has been drinking more alcohol than his baseline. Lipase elevated. LFTs are fine. Alcohol level is negative. Most likely appears to be alcohol-induced pancreatitis. I will obtain further workup. Patient remains nothing by mouth except medications I would keep him on IV fluids aggressively hydration, when necessary Zofran when necessary pain medication and scheduled PPI for symptomatic management.  2. alcohol abuse. Monitor him on alcohol withdrawal protocol in telemetry. Use when necessary benzodiazepine. thiamine as well as multivitamins will be provided.  DVT Prophylaxis: subcutaneous Heparin Nutrition: npo  Disposition: Admitted as inpatient, telemetry unit.  Author: Lynden [place], MD [facility] Pager: [phone] 02/24/2015  If 7PM-7AM, please contact night-coverage [email or web address] Password [email or web address]

## 2015-02-24 NOTE — Progress Notes (Signed)
[facility] PROGRESS NOTE  Isaac Daniel ZOX:[id number] DOB: 1986-06-15 DOA: 02/24/2015 PCP: No PCP Per Patient  Same Day Note - Patient Admitted This AM  Assessment/Plan: 1. Acute alcoholic pancreatitis 1. Elevated lipase in the setting of significant ETOH abuse 2. Cont with bowel rest and analgesics as tolerated 2. ETOH abuse 1. Pt reports daily ETOH abuse 2. Cessation done at bedside 3. Patient states he wants to quit - agreeable to joining outpatient alcoholic program 4. Will consult case management for assistance  3. DVT prophylaixs 1. Lovenox subQ  Code Status: Full Family Communication: Pt in room (indicate person spoken with, relationship, and if by phone, the number) Disposition Plan: Pending   Consultants:    Procedures:    Antibiotics:   (indicate start date, and stop date if known)  HPI/Subjective: Still complains of abd pain  Objective: Filed Vitals:   02/24/15 0200 02/24/15 0305 02/24/15 0412 02/24/15 1321  BP: 146/97 139/91 144/93 148/98  Pulse: 108 65 63 83  Temp:   97.7 F (36.5 C) 98.1 F (36.7 C)  TempSrc:   Oral Oral  Resp:  Height:    (1.854 m)   Weight:   67.359 kg (148 lb 8 oz)   SpO2: 96% 100% 99% 97%    Intake/Output Summary (Last 24 hours) at 02/24/15 1721 Last data filed at 02/24/15 1322  Gross per 24 hour  Intake 179.17 ml  Output    400 ml  Net -220.83 ml   Filed Weights   02/24/15 0412  Weight: 67.359 kg (148 lb 8 oz)    Exam:   General:  Awake, in nad  Cardiovascular: regular, s1, s2  Respiratory: normal resp effort, no wheezing  Abdomen: soft,nondistended  Musculoskeletal: perfused, no clubbing   Data Reviewed: Basic Metabolic Panel:  Recent Labs Lab 02/23/15 2304 02/24/15 0259  NA 138 137  Daniel 3.5 4.0  CL 99* 102  CO2 27 25  GLUCOSE 113* 99  BUN 9 8  CREATININE 0.82 0.71  CALCIUM 9.6 8.7*   Liver Function Tests:  Recent Labs Lab 02/23/15 2304 02/24/15 0259  AST 39 34   ALT 17 18  ALKPHOS 54 46  BILITOT 1.0 1.0  PROT 8.1 7.1  ALBUMIN 4.8 4.0    Recent Labs Lab 02/23/15 2304  LIPASE 1153*   No results for input(s): AMMONIA in the last 168 hours. CBC:  Recent Labs Lab 02/23/15 2304 02/24/15 0259  WBC 9.0 10.5  NEUTROABS 7.3 8.9*  HGB 14.9 14.2  HCT 42.4 41.2  MCV 96.4 97.4  PLT 190 163   Cardiac Enzymes: No results for input(s): CKTOTAL, CKMB, CKMBINDEX, TROPONINI in the last 168 hours. BNP (last 3 results) No results for input(s): BNP in the last 8760 hours.  ProBNP (last 3 results) No results for input(s): PROBNP in the last 8760 hours.  CBG: No results for input(s): GLUCAP in the last 168 hours.  No results found for this or any previous visit (from the past 240 hour(s)).   Studies: No results found.  Scheduled Meds: . enoxaparin (LOVENOX) injection  40 mg Subcutaneous Q24H  . folic acid  1 mg Oral Daily  . multivitamin with minerals  1 tablet Oral Daily  . pantoprazole (PROTONIX) IV  40 mg Intravenous Q24H  . [START ON 02/25/2015] pneumococcal 23 valent vaccine  0.5 mL Intramuscular Tomorrow-1000  . sodium chloride  3 mL Intravenous Q12H  . thiamine  100 mg Oral Daily   Or  .  thiamine  100 mg Intravenous Daily   Continuous Infusions: . sodium chloride 125 mL/hr at 02/24/15 1036    Principal Problem:   Pancreatitis, alcoholic, acute Active Problems:   Alcohol use    Isaac Daniel, Isaac Daniel  [facility] Pager [phone]. If 7PM-7AM, please contact night-coverage at [email or web address], password [email or web address] 02/24/2015, 5:21 PM  LOS: 0 days

## 2015-02-24 NOTE — ED Provider Notes (Signed)
CSN: [id number]     Arrival date & time 02/23/15  2226 History   First MD Initiated Contact with Patient 02/24/15 0117     Chief Complaint  Patient presents with  . Abdominal Pain  . Emesis     (Consider location/radiation/quality/duration/timing/severity/associated sxs/prior Treatment) Patient is a 29 y.o. male presenting with abdominal pain and vomiting. The history is provided by the patient. No language interpreter was used.  Abdominal Pain Pain location:  RUQ and LUQ Pain quality: cramping and sharp   Pain severity:  Severe Associated symptoms: diarrhea, nausea and vomiting   Associated symptoms: no chest pain, no chills, no cough and no fever   Associated symptoms comment:  Symptoms started one week ago with diarrhea, with nausea and vomiting starting yesterday, greater than 20 times since onset. No fever. He denies hematemesis or bloody stool. He denies sick contacts. He has had similar symptoms in the past, he states, related to alcohol overuse, but no admissions to the hospital. He admits to heavy drinking in the last several days.  Emesis Associated symptoms: abdominal pain and diarrhea   Associated symptoms: no chills and no myalgias     History reviewed. No pertinent past medical history. Past Surgical History  Procedure Laterality Date  . Appendectomy     No family history on file. History  Substance Use Topics  . Smoking status: Current Every Day Smoker -- 0.50 packs/day    Types: Cigarettes  . Smokeless tobacco: Not on file  . Alcohol Use: 1.8 oz/week    3 Cans of beer per week    Review of Systems  Constitutional: Negative for fever and chills.  Respiratory: Negative.  Negative for cough.   Cardiovascular: Negative.  Negative for chest pain.  Gastrointestinal: Positive for nausea, vomiting, abdominal pain and diarrhea. Negative for blood in stool.  Musculoskeletal: Negative.  Negative for myalgias.  Skin: Negative.   Neurological: Negative.  Negative for  light-headedness.      Allergies  Review of patient's allergies indicates no known allergies.  Home Medications   Prior to Admission medications   Medication Sig Start Date End Date Taking? Authorizing Provider  HYDROcodone-acetaminophen (NORCO/VICODIN) 5-325 MG per tablet Take 2 tablets by mouth every 4 (four) hours as needed. Patient not taking: Reported on 02/24/2015 08/11/14   Eber Hong, MD  naproxen (NAPROSYN) 500 MG tablet Take 1 tablet (500 mg total) by mouth 2 (two) times daily with a meal. Patient not taking: Reported on 02/24/2015 08/11/14   Eber Hong, MD  penicillin v potassium (VEETID) 250 MG tablet Take 2 tablets (500 mg total) by mouth 4 (four) times daily. Patient not taking: Reported on 02/24/2015 08/11/14   Eber Hong, MD   BP 152/98 mmHg  Pulse 114  Temp(Src) 97.5 F (36.4 C) (Oral)  Resp 18  SpO2 95% Physical Exam  Constitutional: He is oriented to person, place, and time. He appears well-developed and well-nourished.  Neck: Normal range of motion.  Pulmonary/Chest: Effort normal.  Abdominal: Soft. He exhibits no distension and no mass. There is tenderness. There is guarding.  Significantly tender in the upper quadrants.  Musculoskeletal: Normal range of motion.  Neurological: He is alert and oriented to person, place, and time.  Skin: Skin is warm and dry.  Psychiatric: He has a normal mood and affect.    ED Course  Procedures (including critical care time) Labs Review Labs Reviewed  CBC WITH DIFFERENTIAL/PLATELET - Abnormal; Notable for the following:    Neutrophils Relative % 81 (*)  Lymphocytes Relative 11 (*)    All other components within normal limits  COMPREHENSIVE METABOLIC PANEL - Abnormal; Notable for the following:    Chloride 99 (*)    Glucose, Bld 113 (*)    All other components within normal limits  LIPASE, BLOOD - Abnormal; Notable for the following:    Lipase 1153 (*)    All other components within normal limits   URINALYSIS, ROUTINE W REFLEX MICROSCOPIC (NOT AT [facility])   Results for orders placed or performed during the hospital encounter of 02/24/15  CBC with Differential  Result Value Ref Range   WBC 9.0 4.0 - 10.5 K/uL   RBC 4.40 4.22 - 5.81 MIL/uL   Hemoglobin 14.9 13.0 - 17.0 g/dL   HCT 16.142.4 09.639.0 - 04.552.0 %   MCV 96.4 78.0 - 100.0 fL   MCH 33.9 26.0 - 34.0 pg   MCHC 35.1 30.0 - 36.0 g/dL   RDW 40.914.4 81.111.5 - 91.415.5 %   Platelets 190 150 - 400 K/uL   Neutrophils Relative % 81 (H) 43 - 77 %   Neutro Abs 7.3 1.7 - 7.7 K/uL   Lymphocytes Relative 11 (L) 12 - 46 %   Lymphs Abs 1.0 0.7 - 4.0 K/uL   Monocytes Relative 8 3 - 12 %   Monocytes Absolute 0.7 0.1 - 1.0 K/uL   Eosinophils Relative 0 0 - 5 %   Eosinophils Absolute 0.0 0.0 - 0.7 K/uL   Basophils Relative 0 0 - 1 %   Basophils Absolute 0.0 0.0 - 0.1 K/uL  Comprehensive metabolic panel  Result Value Ref Range   Sodium 138 135 - 145 mmol/L   Potassium 3.5 3.5 - 5.1 mmol/L   Chloride 99 (L) 101 - 111 mmol/L   CO2 27 22 - 32 mmol/L   Glucose, Bld 113 (H) 65 - 99 mg/dL   BUN 9 6 - 20 mg/dL   Creatinine, Ser 7.820.82 0.61 - 1.24 mg/dL   Calcium 9.6 8.9 - 95.610.3 mg/dL   Total Protein 8.1 6.5 - 8.1 g/dL   Albumin 4.8 3.5 - 5.0 g/dL   AST 39 15 - 41 U/L   ALT 17 17 - 63 U/L   Alkaline Phosphatase 54 38 - 126 U/L   Total Bilirubin 1.0 0.3 - 1.2 mg/dL   GFR calc non Af Amer >60 >60 mL/min   GFR calc Af Amer >60 >60 mL/min   Anion gap 12 5 - 15  Lipase, blood  Result Value Ref Range   Lipase 1153 (H) 22 - 51 U/L     Imaging Review No results found.   EKG Interpretation None      MDM   Final diagnoses:  None    1. Pancreatitis  The patient has new pancreatitis secondary to alcohol excess. VSS, although becoming tachycardic. No fever, soft abdomen, stable vitals. Will admit to [facility] for further management.     Elpidio AnisShari Kamorah Nevils, PA-C 02/24/15 [id number]  Derwood KaplanAnkit Nanavati, MD 02/25/15 [phone]

## 2015-02-24 NOTE — ED Notes (Signed)
Informed the pt a urine specimen is needed. 

## 2015-02-24 NOTE — H&P (Signed)
[facility] History and Physical  Patient: Isaac Daniel  MRN: [id number]  DOB: 03/23/1986  DOS: the patient was seen and examined on 02/24/2015 PCP: No PCP Per Patient  Referring physician: Shari Upstill, PA-C Chief Complaint: Abdominal pain  HPI: Isaac Daniel is a 29 y.o. male with Past medical history of alcohol use. Patient presents with complaints of abdominal pain ongoing since last 2 days. He mentions that he had one week of diarrhea with nausea and vomiting. The diarrhea has been watery loose without any blood. The diarrhea has improved but since last 2 days he had increasing episodes of nausea as well as abdominal pain which is located in the upper abdomen. He mentions he drinks 2-3 cans of beer 40 ounce on a daily basis and since last few days he has also been using liqueur along with that. He denies any fever, basal some chills. Denies any chest pain or shortness of breath. Comparison of acid reflux. He does not have any burning urination. Denies any drugs denies any prescription medication use.  The patient is coming from home. And at his baseline independent for most of his ADL.  Review of Systems: as mentioned in the history of present illness.  A comprehensive review of the other systems is negative.  History reviewed. No pertinent past medical history. Past Surgical History  Procedure Laterality Date  . Appendectomy     Social History:  reports that he has been smoking Cigarettes.  He has been smoking about 0.50 packs per day. He does not have any smokeless tobacco history on file. He reports that he drinks about 1.8 oz of alcohol per week. He reports that he uses illicit drugs (Marijuana).  No Known Allergies  Family History  Problem Relation Age of Onset  . Diabetes Maternal Grandmother   . Diabetes Paternal Grandmother     Prior to Admission medications   Not on File    Physical Exam: Filed Vitals:   02/24/15 0130 02/24/15 0200 02/24/15 0305  02/24/15 0412  BP: 156/99 146/97 139/91 144/93  Pulse: 29 108 65 63  Temp:    97.7 F (36.5 C)  TempSrc:    Oral  Resp:   18 18  Height:    6' 1" (1.854 m)  Weight:    67.359 kg (148 lb 8 oz)  SpO2: 99% 96% 100% 99%    General: Alert, Awake and Oriented to Time, Place and Person. Appear in mild distress Eyes: PERRL ENT: Oral Mucosa clear moist. Neck: no JVD Cardiovascular: S1 and S2 Present, no Murmur, Peripheral Pulses Present Respiratory: Bilateral Air entry equal and Decreased,  Clear to Auscultation, no Crackles, no wheezes Abdomen: Bowel Sound  Present, Soft and upper abdomen tender Skin: no Rash Extremities: no Pedal edema, no calf tenderness Neurologic: Grossly no focal neuro deficit. Labs on Admission:  CBC:  Recent Labs Lab 02/23/15 2304 02/24/15 0259  WBC 9.0 10.5  NEUTROABS 7.3 8.9*  HGB 14.9 14.2  HCT 42.4 41.2  MCV 96.4 97.4  PLT 190 163    CMP     Component Value Date/Time   NA 137 02/24/2015 0259   K 4.0 02/24/2015 0259   CL 102 02/24/2015 0259   CO2 25 02/24/2015 0259   GLUCOSE 99 02/24/2015 0259   BUN 8 02/24/2015 0259   CREATININE 0.71 02/24/2015 0259   CALCIUM 8.7* 02/24/2015 0259   PROT 7.1 02/24/2015 0259   ALBUMIN 4.0 02/24/2015 0259   AST 34 02/24/2015 0259   ALT 18   02/24/2015 0259   ALKPHOS 46 02/24/2015 0259   BILITOT 1.0 02/24/2015 0259   GFRNONAA >60 02/24/2015 0259   GFRAA >60 02/24/2015 0259     Recent Labs Lab 02/23/15 2304  LIPASE 1153*    No results for input(s): CKTOTAL, CKMB, CKMBINDEX, TROPONINI in the last 168 hours. BNP (last 3 results) No results for input(s): BNP in the last 8760 hours.  ProBNP (last 3 results) No results for input(s): PROBNP in the last 8760 hours.   Radiological Exams on Admission: No results found. Assessment/Plan Principal Problem:   Pancreatitis, alcoholic, acute Active Problems:   Alcohol use   1. Pancreatitis, alcoholic, acute The patient is presenting with numbness of  abdominal pain. Located in the epigastric region. The patient has been drinking more alcohol than his baseline. Lipase elevated. LFTs are fine. Alcohol level is negative. Most likely appears to be alcohol-induced pancreatitis. I will obtain further workup. Patient remains nothing by mouth except medications I would keep him on IV fluids aggressively hydration, when necessary Zofran when necessary pain medication and scheduled PPI for symptomatic management.  2. alcohol abuse. Monitor him on alcohol withdrawal protocol in telemetry. Use when necessary benzodiazepine. thiamine as well as multivitamins will be provided.  DVT Prophylaxis: subcutaneous Heparin Nutrition: npo  Disposition: Admitted as inpatient, telemetry unit.  Author: Pranav Patel, MD [facility] Pager: [phone] 02/24/2015  If 7PM-7AM, please contact night-coverage [email or web address] Password [email or web address]  

## 2015-02-25 DIAGNOSIS — F1099 Alcohol use, unspecified with unspecified alcohol-induced disorder: Secondary | ICD-10-CM

## 2015-02-25 LAB — LIPASE, BLOOD: Lipase: 344 U/L — ABNORMAL HIGH (ref 22–51)

## 2015-02-25 MED ORDER — OXYCODONE-ACETAMINOPHEN 5-325 MG PO TABS
1.0000 | ORAL_TABLET | ORAL | Status: DC | PRN
  Administered 2015-02-25 (×2): 2 via ORAL
  Administered 2015-02-26: 1 via ORAL
  Administered 2015-02-26: 2 via ORAL
  Filled 2015-02-25: qty 1
  Filled 2015-02-25 (×3): qty 2

## 2015-02-25 MED ORDER — MORPHINE SULFATE 2 MG/ML IJ SOLN: 2.0000 mg | INTRAMUSCULAR | Status: DC | PRN

## 2015-02-25 NOTE — Progress Notes (Signed)
[facility] PROGRESS NOTE  Karis Emig ZOX:[id number] DOB: 01/19/1986 DOA: 02/24/2015 PCP: No PCP Per Patient  Assessment/Plan: 1. Acute alcoholic pancreatitis 1. Pt presented with elevated lipase in the setting of significant ETOH abuse 2. Much improved with bowel rest and IVF 3. Pt's diet is quickly and successfully advancing 4. Lipase is now down to the 300's 2. ETOH abuse 1. Pt reports daily ETOH abuse prior to admission 2. Cessation was done at bedside and pt is very interested in joining outpatient alcoholic program 3. Appreciate case management assistance  3. DVT prophylaixs 1. Lovenox subQ  Code Status: Full Family Communication: Pt in room Disposition Plan: Possible d/c home 5/28 if stable   Consultants:    Procedures:    Antibiotics:  none  HPI/Subjective: Feels much better today and tolerating clears, wants regular diet. Reports only some epigastric pain with eating.  Objective: Filed Vitals:   02/24/15 1321 02/24/15 2046 02/25/15 0612 02/25/15 1400  BP: 148/98 139/98 137/92 134/94  Pulse: 83 90 92 90  Temp: 98.1 F (36.7 C) 97.8 F (36.6 C) 99.2 F (37.3 C) 98.2 F (36.8 C)  TempSrc: Oral Oral Oral Oral  Resp: Height:      Weight:      SpO2: 97% 99% 99% 99%    Intake/Output Summary (Last 24 hours) at 02/25/15 1500 Last data filed at 02/25/15 1400  Gross per 24 hour  Intake 3870.83 ml  Output   2525 ml  Net 1345.83 ml   Filed Weights   02/24/15 0412  Weight: 67.359 kg (148 lb 8 oz)    Exam:   General:  Awake, sitting in bed, in nad  Cardiovascular: regular, s1, s2  Respiratory: normal resp effort, no wheezing  Abdomen: soft,nondistended, pos BS  Musculoskeletal: perfused, no clubbing   Data Reviewed: Basic Metabolic Panel:  Recent Labs Lab 02/23/15 2304 02/24/15 0259  NA 138 137  K 3.5 4.0  CL 99* 102  CO2 27 25  GLUCOSE 113* 99  BUN 9 8  CREATININE 0.82 0.71  CALCIUM 9.6 8.7*   Liver Function  Tests:  Recent Labs Lab 02/23/15 2304 02/24/15 0259  AST 39 34  ALT 17 18  ALKPHOS 54 46  BILITOT 1.0 1.0  PROT 8.1 7.1  ALBUMIN 4.8 4.0    Recent Labs Lab 02/23/15 2304 02/25/15 0542  LIPASE 1153* 344*   No results for input(s): AMMONIA in the last 168 hours. CBC:  Recent Labs Lab 02/23/15 2304 02/24/15 0259  WBC 9.0 10.5  NEUTROABS 7.3 8.9*  HGB 14.9 14.2  HCT 42.4 41.2  MCV 96.4 97.4  PLT 190 163   Cardiac Enzymes: No results for input(s): CKTOTAL, CKMB, CKMBINDEX, TROPONINI in the last 168 hours. BNP (last 3 results) No results for input(s): BNP in the last 8760 hours.  ProBNP (last 3 results) No results for input(s): PROBNP in the last 8760 hours.  CBG: No results for input(s): GLUCAP in the last 168 hours.  No results found for this or any previous visit (from the past 240 hour(s)).   Studies: No results found.  Scheduled Meds: . enoxaparin (LOVENOX) injection  40 mg Subcutaneous Q24H  . folic acid  1 mg Oral Daily  . multivitamin with minerals  1 tablet Oral Daily  . pantoprazole (PROTONIX) IV  40 mg Intravenous Q24H  . pneumococcal 23 valent vaccine  0.5 mL Intramuscular Tomorrow-1000  . sodium chloride  3 mL Intravenous Q12H  . thiamine  100  mg Oral Daily   Or  . thiamine  100 mg Intravenous Daily   Continuous Infusions: . sodium chloride 75 mL/hr at 02/25/15 1336    Principal Problem:   Pancreatitis, alcoholic, acute Active Problems:   Alcohol use    Norene Oliveri K  [facility] Pager [phone]. If 7PM-7AM, please contact night-coverage at [email or web address], password [email or web address] 02/25/2015, 3:00 PM  LOS: 1 day

## 2015-02-25 NOTE — Clinical Social Work Note (Signed)
Clinical Social Work Assessment  Patient Details  Name: Isaac Daniel MRN: [id number] Date of Birth: 1986/06/08  Date of referral:  02/25/15               Reason for consult:  Substance Use/ETOH Abuse                Permission sought to share information with:    Permission granted to share information::     Name::        Agency::     Relationship::     Contact Information:     Housing/Transportation Living arrangements for the past 2 months:  Apartment Source of Information:  Patient Patient Interpreter Needed:  None Criminal Activity/Legal Involvement Pertinent to Current Situation/Hospitalization:  No - Comment as needed Significant Relationships:  Other Family Members Lives with:  Self Do you feel safe going back to the place where you live?  Yes Need for family participation in patient care:  No (Coment)  Care giving concerns:  No concerns expressed at this time.   Social Worker assessment / plan:  Pt hospitalized on 02/24/15 with a dx of acute pancreatitis. CSW met with pt  to assist with SA resources. Pt reports he has been drinking beer since he was 24 yrs. old. Pt reports he has been drinking daily, 2/3 beers. Just prior to hospitalization pt reports he was also drinking hard liquor. Pt reports his whole family drinks. 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 CSW has provided SA resources. Pt is familiar with [facility] and will contact agency following hospital d/c.   Employment status:  Other (Comment) Insurance information:  Self Pay (Medicaid Pending) PT Recommendations:  Not assessed at this time Information / Referral to community resources:  Outpatient Substance Abuse Treatment Options  Patient/Family's Response to care:  Pt is satisfied with care received and feels he needs assistance with his SA problem.  Patient/Family's Understanding of and Emotional Response to Diagnosis, Current Treatment, and Prognosis:  Pt reports that he understands  continued SA will lead to continued health problems and pain. " I'll never drink again after this experience ", pt states. Pt is motivated to accept assistance and plans to contact [facility]. Emotional Assessment Appearance:  Appears younger than stated age Attitude/Demeanor/Rapport:  Other (cooperative) Affect (typically observed):  Calm, Pleasant, Appropriate, Other (motivated) Orientation:  Oriented to Self, Oriented to Place, Oriented to  Time, Oriented to Situation Alcohol / Substance use:  Alcohol Use Psych involvement (Current and /or in the community):  No (Comment)  Discharge Needs  Concerns to be addressed:  Substance Abuse Concerns Readmission within the last 30 days:  No Current discharge risk:  None Barriers to Discharge:  No Barriers Identified   Argel Pablo, Randall An, LCSW 02/25/2015, 3:13 PM

## 2015-02-25 NOTE — Plan of Care (Signed)
Problem: Phase II Progression Outcomes Goal: Progress activity as tolerated unless otherwise ordered Outcome: Completed/Met Date Met:  02/25/15 Patient ambulated in hallway without difficulty.

## 2015-02-25 NOTE — Progress Notes (Signed)
CSW consulted to assist with SA resources. Pt sleeping soundly when CSW attempted to visit. CSW will return to assess and offer assistance with d/c planning needs.  Cori RazorJamie Chenelle Benning LCSW [phone]

## 2015-02-26 LAB — LIPASE, BLOOD: Lipase: 160 U/L — ABNORMAL HIGH (ref 22–51)

## 2015-02-26 MED ORDER — POLYETHYLENE GLYCOL 3350 17 G PO PACK
17.0000 g | PACK | Freq: Every day | ORAL | Status: DC
  Administered 2015-02-26: 17 g via ORAL
  Filled 2015-02-26: qty 1

## 2015-02-26 MED ORDER — POLYETHYLENE GLYCOL 3350 17 G PO PACK: 17.0000 g | PACK | Freq: Every day | ORAL | Status: DC

## 2015-02-26 MED ORDER — ONDANSETRON HCL 4 MG PO TABS: 4.0000 mg | ORAL_TABLET | Freq: Four times a day (QID) | ORAL | Status: DC | PRN

## 2015-02-26 MED ORDER — OXYCODONE-ACETAMINOPHEN 5-325 MG PO TABS: 1.0000 | ORAL_TABLET | ORAL | Status: DC | PRN

## 2015-02-26 NOTE — Discharge Summary (Signed)
Physician Discharge Summary  Isaac Daniel ZOX:[id number] DOB: 04-19-1986 DOA: 02/24/2015  PCP: No PCP Per Patient  Admit date: 02/24/2015 Discharge date: 02/26/2015  Time spent: 20 minutes  Recommendations for Outpatient Follow-up:  1. Establish and follow up with PCP in 1-2 weeks  Discharge Diagnoses:  Principal Problem:   Pancreatitis, alcoholic, acute Active Problems:   Alcohol use   Discharge Condition: Improved  Diet recommendation: Regular  Filed Weights   02/24/15 0412 02/26/15 0603  Weight: 67.359 kg (148 lb 8 oz) 69.4 kg (153 lb)    History of present illness:  Please see admit h and p from 5/26 for details. Briefly, pt presented with marked abd pains in the setting of etoh abuse. Lipase was found to be over 1100 and patient was admitted for acute alcoholic pancreatitis.  Hospital Course:  1. Acute alcoholic pancreatitis 1. Pt presented with elevated lipase in the setting of significant ETOH abuse 2. Clinically much improved with bowel rest and IVF 3. Pt's diet is quickly and successfully advanced to regular diet 4. Lipase continues to trend down nicely by the day of discharge 2. ETOH abuse 1. Pt reports daily ETOH abuse prior to admission 2. Cessation was done at bedside and pt is very interested in joining outpatient alcoholic program 3. Appreciate case management assistance  3. DVT prophylaixs 1. Lovenox subQ  Discharge Exam: Filed Vitals:   02/25/15 1400 02/25/15 2300 02/26/15 0500 02/26/15 0603  BP: 134/94 138/100 151/98   Pulse: 90 83 79   Temp: 98.2 F (36.8 C) 99 F (37.2 C) 98.2 F (36.8 C)   TempSrc: Oral Oral Oral   Resp: Height:      Weight:    69.4 kg (153 lb)  SpO2: 99% 100% 93%     General: Awake, in nad Cardiovascular: regular, s1, s2 Respiratory: normal resp effort, no wheezing  Discharge Instructions     Medication List    TAKE these medications        ondansetron 4 MG tablet  Commonly known as:  ZOFRAN  Take  1 tablet (4 mg total) by mouth every 6 (six) hours as needed for nausea.     oxyCODONE-acetaminophen 5-325 MG per tablet  Commonly known as:  PERCOCET/ROXICET  Take 1-2 tablets by mouth every 4 (four) hours as needed for severe pain.     polyethylene glycol packet  Commonly known as:  MIRALAX / GLYCOLAX  Take 17 g by mouth daily.       No Known Allergies Follow-up Information    Follow up with Please establish with PCP in 1-2 weeks.   Why:  Hospital follow up       The results of significant diagnostics from this hospitalization (including imaging, microbiology, ancillary and laboratory) are listed below for reference.    Significant Diagnostic Studies: No results found.  Microbiology: No results found for this or any previous visit (from the past 240 hour(s)).   Labs: Basic Metabolic Panel:  Recent Labs Lab 02/23/15 2304 02/24/15 0259  NA 138 137  K 3.5 4.0  CL 99* 102  CO2 27 25  GLUCOSE 113* 99  BUN 9 8  CREATININE 0.82 0.71  CALCIUM 9.6 8.7*   Liver Function Tests:  Recent Labs Lab 02/23/15 2304 02/24/15 0259  AST 39 34  ALT 17 18  ALKPHOS 54 46  BILITOT 1.0 1.0  PROT 8.1 7.1  ALBUMIN 4.8 4.0    Recent Labs Lab 02/23/15 2304 02/25/15 0542 02/26/15  0505  LIPASE 1153* 344* 160*   No results for input(s): AMMONIA in the last 168 hours. CBC:  Recent Labs Lab 02/23/15 2304 02/24/15 0259  WBC 9.0 10.5  NEUTROABS 7.3 8.9*  HGB 14.9 14.2  HCT 42.4 41.2  MCV 96.4 97.4  PLT 190 163   Cardiac Enzymes: No results for input(s): CKTOTAL, CKMB, CKMBINDEX, TROPONINI in the last 168 hours. BNP: BNP (last 3 results) No results for input(s): BNP in the last 8760 hours.  ProBNP (last 3 results) No results for input(s): PROBNP in the last 8760 hours.  CBG: No results for input(s): GLUCAP in the last 168 hours.  Signed:  Gwyndolyn Guilford K  [facility] 02/26/2015, 10:52 AM

## 2015-02-26 NOTE — Care Management Note (Signed)
Case Management Note  Patient Details  Name: Isaac Daniel MRN: [id number] Date of Birth: 09/30/1986    Expected Discharge Date:    02/26/2015              Expected Discharge Plan:  Home/Self Care  In-House Referral:     Discharge planning Services  CM Consult, [facility]  Status of Service:  Completed, signed off  Medicare Important Message Given:  No Date Medicare IM Given:    Medicare IM give by:    Date Additional Medicare IM Given:    Additional Medicare Important Message give by:     If discussed at Long Length of Stay Meetings, dates discussed:    Additional Comments: NCM [facility] pt [facility] to call on 03/01/2015 to arrange appt for hospital follow up. Explained to pt he will need a $20.00 copay.  Elliot CousinShavis, Wassim Kirksey Ellen, RN 02/26/2015, 12:37 PM

## 2015-02-26 NOTE — Progress Notes (Signed)
Pt discharged.  Leaving with prescriptions.  Taking bus home.  Bus pass provided.  Denies pain.  No complaints.  Respirations even and unlabored.

## 2017-04-08 ENCOUNTER — Encounter (HOSPITAL_COMMUNITY): Payer: Self-pay | Admitting: Emergency Medicine

## 2017-04-08 ENCOUNTER — Emergency Department (HOSPITAL_COMMUNITY)
Admission: EM | Admit: 2017-04-08 | Discharge: 2017-04-08 | Disposition: A | Discharge status: Home / Self Care | Payer: Self-pay | Attending: Emergency Medicine | Admitting: Emergency Medicine

## 2017-04-08 ENCOUNTER — Emergency Department (HOSPITAL_COMMUNITY): Payer: Self-pay

## 2017-04-08 DIAGNOSIS — M79672 Pain in left foot: Secondary | ICD-10-CM | POA: Insufficient documentation

## 2017-04-08 DIAGNOSIS — F17210 Nicotine dependence, cigarettes, uncomplicated: Secondary | ICD-10-CM | POA: Insufficient documentation

## 2017-04-08 MED ORDER — ACETAMINOPHEN 500 MG PO TABS
1000.0000 mg | ORAL_TABLET | Freq: Once | ORAL | Status: AC
  Administered 2017-04-08: 1000 mg via ORAL
  Filled 2017-04-08: qty 2

## 2017-04-08 MED ORDER — NAPROXEN 500 MG PO TABS: 500.0000 mg | ORAL_TABLET | Freq: Two times a day (BID) | ORAL | 0 refills | Status: DC

## 2017-04-08 NOTE — ED Triage Notes (Signed)
Pt c/o 10/10 left foot pain, pt states he works on [employer] and he is having 10/10 pain for a week, pt states he got hit by a pice of concrete and pain is progressively worse.

## 2017-04-08 NOTE — Progress Notes (Signed)
Orthopedic Tech Progress Note Patient Details:  Isaac SoloJulio Durbin 05/01/1986 [id number]  Ortho Devices Type of Ortho Device: ASO, Crutches Ortho Device/Splint Location: Left Foot/Ankle Ortho Device/Splint Interventions: Application, Adjustment   Alvina ChouWilliams, Yarimar Lavis C 04/08/2017, 10:31 PM

## 2017-04-08 NOTE — Discharge Instructions (Signed)
Be sure to read and understand instructions below prior to leaving the hospital. If your symptoms persist without any improvement in 1 week it is reccommended that you follow up with wellness center. Use your pain medication as prescribed as needed. For pain control you may take: The naproxen that I am prescribing you up to 2 times daily. You can also take acetaminophen 975mg  (this is 3 over the counter pills) four times a day. Do not drink alcohol or combine with other medications that have acetaminophen as an ingredient (Read the labels!).    Ankle Sprain  An ankle sprain is an injury to the ligaments that hold the ankle joint together. Your X-ray today showed no evidence of fracture, however keep all follow-up appointments with an orthopedic specialist to have follow-up X-rays, because as we discussed fractures may not appear until 3 days after the acute injury.    TREATMENT  Rest, ice, elevation, and compression are the basic modes of treatment.    HOME CARE INSTRUCTIONS  Apply ice to the sore area for 15 to 20 minutes, 3 to 4 times per day. Do this while you are awake for the first 2 days, or as directed. This can be stopped when the swelling goes away. Put the ice in a plastic bag and place a towel between the bag of ice and your skin.  Keep your leg elevated when possible to lessen swelling.  If your caregiver recommends crutches, use them as instructed for 1 week. Then, you may walk on your ankle weight bearing as tolerated.  You may take off your ankle stabilizer at night and to take a shower or bath. Wiggle your toes in the splint several times per day if you are able.  Do not drive a vehicle on pain medication. ACTIVITY:            - Weight bearing as tolerated            - Exercises should be limited to pain free range of motion            - Can start mobilization by tracing the alphabet with your foot in the air.       SEEK MEDICAL CARE IF:  You have an increase in bruising,  swelling, or pain.  Your toes feel cold.  Pain relief is not achieved with medications.  EMERGENCY:: Your toes are numb or blue or you have severe pain.  MAKE SURE YOU:  Understand these instructions.  Will watch your condition.  Will get help right away if you are not doing well or get worse   COLD THERAPY DIRECTIONS:  Ice or gel packs can be used to reduce both pain and swelling. Ice is the most helpful within the first 24 to 48 hours after an injury or flareup from overusing a muscle or joint.  Ice is effective, has very few side effects, and is safe for most people to use.   If you expose your skin to cold temperatures for too long or without the proper protection, you can damage your skin or nerves. Watch for signs of skin damage due to cold.   HOME CARE INSTRUCTIONS  Follow these tips to use ice and cold packs safely.  Place a dry or damp towel between the ice and skin. A damp towel will cool the skin more quickly, so you may need to shorten the time that the ice is used.  For a more rapid response, add gentle compression to the  ice.  Ice for no more than 10 to 20 minutes at a time. The bonier the area you are icing, the less time it will take to get the benefits of ice.  Check your skin after 5 minutes to make sure there are no signs of a poor response to cold or skin damage.  Rest 20 minutes or more in between uses.  Once your skin is numb, you can end your treatment. You can test numbness by very lightly touching your skin. The touch should be so light that you do not see the skin dimple from the pressure of your fingertip. When using ice, most people will feel these normal sensations in this order: cold, burning, aching, and numbness.  Do not use ice on someone who cannot communicate their responses to pain, such as small children or people with dementia.   HOW TO MAKE AN ICE PACK  To make an ice pack, do one of the following:  Place crushed ice or a bag of frozen vegetables in a  sealable plastic bag. Squeeze out the excess air. Place this bag inside another plastic bag. Slide the bag into a pillowcase or place a damp towel between your skin and the bag.  Mix 3 parts water with 1 part rubbing alcohol. Freeze the mixture in a sealable plastic bag. When you remove the mixture from the freezer, it will be slushy. Squeeze out the excess air. Place this bag inside another plastic bag. Slide the bag into a pillowcase or place a damp towel between your sk  You blood pressure was also elevated during todays visit. Please call and schedule an appointment with your PCP or at the wellness center to be seen to discuss this or persistent symptoms.If you develop worsening or new concerning symptoms you can return to the emergency department for re-evaluation.

## 2017-04-08 NOTE — ED Provider Notes (Signed)
[facility]-EMERGENCY DEPT Provider Note   CSN: [id number] Arrival date & time: 04/08/17  1847     History   Chief Complaint Chief Complaint  Patient presents with  . Foot Pain    left    HPI Isaac Daniel is a 31 y.o. male presenting to the emergency department today with left ankle/foot pain x 1 week. 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 The patient states his pain has been constant, located on the medial aspect of his ankle and the top of his foot, rated 10/10, worsened by walking. The patient has not taken anything for the pain and notes no releaving factors. No fever, erythema, rash, swelling, decreased rom, weakness. No numbness or tingling. No IVDU. No previous surgery to the foot or ankle.   HPI  History reviewed. No pertinent past medical history.  Patient Active Problem List   Diagnosis Date Noted  . Pancreatitis, alcoholic, acute 02/24/2015  . 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 Alcohol use 02/24/2015    Past Surgical History:  Procedure Laterality Date  . APPENDECTOMY         Home Medications    Prior to Admission medications   Medication Sig Start Date End Date Taking? Authorizing Provider  naproxen (NAPROSYN) 500 MG tablet Take 1 tablet (500 mg total) by mouth 2 (two) times daily. 04/08/17   Khiya Friese, Elmer Sow, PA-C  ondansetron (ZOFRAN) 4 MG tablet Take 1 tablet (4 mg total) by mouth every 6 (six) hours as needed for nausea. 02/26/15   Jerald Kief, MD  oxyCODONE-acetaminophen (PERCOCET/ROXICET) 5-325 MG per tablet Take 1-2 tablets by mouth every 4 (four) hours as needed for severe pain. 02/26/15   Jerald Kief, MD  polyethylene glycol [facility] / Ethelene Hal) packet Take 17 g by mouth daily. 02/26/15   Jerald Kief, MD    Family History Family History  Problem Relation Age of Onset  . Diabetes Maternal Grandmother   . Diabetes Paternal Grandmother      Social History Social History  Substance Use Topics  . Smoking status: Current Every Day Smoker    Packs/day: 0.50    Types: Cigarettes  . Smokeless tobacco: Never Used  . Alcohol use 1.8 oz/week    3 Cans of beer per week     Allergies   Patient has no known allergies.   Review of Systems Review of Systems  Constitutional: Negative for fever.  Musculoskeletal: Positive for arthralgias. Negative for myalgias.  Skin: Negative for wound.  Neurological: Negative for weakness and numbness.     Physical Exam Updated Vital Signs BP (!) 138/95   Pulse 96   Temp 98.9 F (37.2 C) (Oral)   Resp 17   Ht 6\' 1"  (1.854 m)   Wt 74.4 kg (164 lb)   SpO2 98%   BMI 21.64 kg/m   Physical Exam  Constitutional: He appears well-developed and well-nourished.  HENT:  Head: Normocephalic and atraumatic.  Right Ear: External ear normal.  Left Ear: External ear normal.  Eyes: Conjunctivae are normal. Right eye exhibits no discharge. Left eye exhibits no discharge. No scleral icterus.  Cardiovascular:  Pulses:      Dorsalis pedis pulses are 2+ on the right side, and 2+ on the left side.       Posterior tibial pulses are 2+ on the right side, and 2+ on the left side.  Pulmonary/Chest: Effort normal. No respiratory distress.  Musculoskeletal:       Left knee: Normal.       Left lower leg: Normal.       Left foot: There is tenderness. There is normal range of motion, no swelling, normal capillary refill, no crepitus, no deformity and no laceration.       Feet:  Neurovascularly intact distally. Compartments soft.  Neurological: He is alert. He has normal strength. No sensory deficit. Gait normal.  Skin: No erythema. No pallor.  Psychiatric: He has a normal mood and affect.  Nursing note and vitals reviewed.    ED Treatments / Results  Labs (all labs ordered are listed, but only abnormal results are displayed) Labs Reviewed - No data to display  EKG  EKG  Interpretation None       Radiology Dg Ankle Complete Left  Result Date: 04/08/2017 CLINICAL DATA:  Left foot and ankle pain after injury. Dropped concrete block on foot 1 week prior. EXAM: LEFT ANKLE COMPLETE - 3+ VIEW COMPARISON:  None. FINDINGS: There is no evidence of fracture, dislocation, or joint effusion. There is no evidence of arthropathy or other focal bone abnormality. Soft tissues are unremarkable. IMPRESSION: Negative radiographs of the left ankle. Electronically Signed   By: Rubye OaksMelanie  Ehinger M.D.   On: 04/08/2017 21:09   Dg Foot Complete Left  Result Date: 04/08/2017 CLINICAL DATA:  Left foot and ankle pain. Dropped concrete block on left foot 1 week prior. EXAM: LEFT FOOT - COMPLETE 3+ VIEW COMPARISON:  None. FINDINGS: There is no evidence of fracture or dislocation. Small well corticated osseous density adjacent to the medial aspect of the first metatarsal phalangeal joint may be an accessory ossicle or sequela of remote prior injury. There is no evidence of arthropathy or other focal bone abnormality. Mild soft tissue edema of the great toe. IMPRESSION: Soft tissue edema.  No acute fracture or dislocation. Electronically Signed   By: Rubye OaksMelanie  Ehinger M.D.   On: 04/08/2017 21:11    Procedures Procedures (including critical care time)  Medications Ordered in ED Medications  acetaminophen (TYLENOL) tablet 1,000 mg (not administered)     Initial Impression / Assessment and Plan / ED Course  I have reviewed the triage vital signs and the nursing notes.  Pertinent labs & imaging results that were available during my care of the patient were reviewed by me and considered in my medical decision making (see chart for details).     31 year old male presenting today for a left foot/ankle pain. Patient is afebrile on presentation, nontoxic appearing. Exam reveals normal range of motion and strength. There is tenderness over the left medial malleoli and the top of the foot.  Neurovascular intact distally. Compartments soft. Left knee unremarkable. Patient is able to walk appropriately. X-rays ordered for left foot and ankle. X-rays without acute bony abnormalities. We'll discharge the patient home with ASO brace, crutches, naproxen for pain relief, Rice therapy and written note for work. Discussed with the patient to follow up with primary care provider for persistent symptoms. Discussed with the patient that he can return at any time for new or worsening symptoms. Specific return course discussed. Patient will appearing and stable for discharge. All questions answered.   Also discussed with patient and his blood pressure is elevated during today's visit and that he'll need to discuss this with his primary care provider during his follow-up visit to discuss if he changes in his medication are needed to manage this. Patient verbalizes understanding.   Final Clinical Impressions(s) /  ED Diagnoses   Final diagnoses:  Foot pain, left    New Prescriptions New Prescriptions   NAPROXEN (NAPROSYN) 500 MG TABLET    Take 1 tablet (500 mg total) by mouth 2 (two) times daily.     Jacinto Halim, PA-C 04/08/17 2155    Jacinto Halim, PA-C 04/08/17 2156    Niel Hummer, MD 04/09/17 [phone]

## 2017-05-25 ENCOUNTER — Emergency Department (HOSPITAL_COMMUNITY): Payer: Self-pay

## 2017-05-25 ENCOUNTER — Emergency Department (HOSPITAL_COMMUNITY)
Admission: EM | Admit: 2017-05-25 | Discharge: 2017-05-25 | Disposition: A | Discharge status: Home / Self Care | Payer: Self-pay | Attending: Emergency Medicine | Admitting: Emergency Medicine

## 2017-05-25 DIAGNOSIS — Y998 Other external cause status: Secondary | ICD-10-CM | POA: Insufficient documentation

## 2017-05-25 DIAGNOSIS — F17210 Nicotine dependence, cigarettes, uncomplicated: Secondary | ICD-10-CM | POA: Insufficient documentation

## 2017-05-25 DIAGNOSIS — Y939 Activity, unspecified: Secondary | ICD-10-CM | POA: Insufficient documentation

## 2017-05-25 DIAGNOSIS — Y33XXXA Other specified events, undetermined intent, initial encounter: Secondary | ICD-10-CM | POA: Insufficient documentation

## 2017-05-25 DIAGNOSIS — Y92008 Other place in unspecified non-institutional (private) residence as the place of occurrence of the external cause: Secondary | ICD-10-CM | POA: Insufficient documentation

## 2017-05-25 DIAGNOSIS — S161XXA Strain of muscle, fascia and tendon at neck level, initial encounter: Secondary | ICD-10-CM

## 2017-05-25 MED ORDER — METHOCARBAMOL 500 MG PO TABS: 500.0000 mg | ORAL_TABLET | Freq: Two times a day (BID) | ORAL | 0 refills | Status: DC

## 2017-05-25 MED ORDER — IBUPROFEN 400 MG PO TABS
400.0000 mg | ORAL_TABLET | Freq: Once | ORAL | Status: AC
  Administered 2017-05-25: 400 mg via ORAL
  Filled 2017-05-25: qty 1

## 2017-05-25 MED ORDER — DICLOFENAC SODIUM 75 MG PO TBEC: 75.0000 mg | DELAYED_RELEASE_TABLET | Freq: Two times a day (BID) | ORAL | 0 refills | Status: DC
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
## 2017-05-25 NOTE — ED Provider Notes (Signed)
Ct cspine no fracture, normal.  Pt has ambulated without difficulty.  Pt counseled on results and advised to follow up with primary care if any problems. An After Visit Summary was printed and given to the patient. Meds ordered this encounter  Medications  . ibuprofen (ADVIL,MOTRIN) tablet 400 mg  . diclofenac (VOLTAREN) 75 MG EC tablet    Sig: Take 1 tablet (75 mg total) by mouth 2 (two) times daily.    Dispense:  14 tablet    Refill:  0    Order Specific Question:   Supervising Provider    Answer:   MILLER, BRIAN [[id number]]  . methocarbamol (ROBAXIN) 500 MG tablet    Sig: Take 1 tablet (500 mg total) by mouth 2 (two) times daily.    Dispense:  20 tablet    Refill:  0    Order Specific Question:   Supervising Provider    Answer:   Eber Hong [[id number]]     Elson Areas, PA-C 05/25/17 1755    Rolan Bucco, MD 05/25/17 [phone]
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
## 2017-05-25 NOTE — ED Notes (Signed)
Pt and friend came back into ED.

## 2017-05-25 NOTE — ED Notes (Signed)
Pt eloped from ED without informing staff.

## 2017-05-25 NOTE — Discharge Instructions (Signed)
Follow up with your Physician if symptoms persist  

## 2020-04-14 ENCOUNTER — Other Ambulatory Visit: Payer: Self-pay

## 2020-04-14 ENCOUNTER — Encounter (HOSPITAL_COMMUNITY): Payer: Self-pay

## 2020-04-14 ENCOUNTER — Emergency Department (HOSPITAL_COMMUNITY)
Admission: EM | Admit: 2020-04-14 | Discharge: 2020-04-14 | Disposition: A | Discharge status: Home / Self Care | Payer: Self-pay | Attending: Emergency Medicine | Admitting: Emergency Medicine

## 2020-04-14 DIAGNOSIS — R21 Rash and other nonspecific skin eruption: Secondary | ICD-10-CM | POA: Insufficient documentation

## 2020-04-14 DIAGNOSIS — Z5321 Procedure and treatment not carried out due to patient leaving prior to being seen by health care provider: Secondary | ICD-10-CM | POA: Insufficient documentation

## 2020-04-14 NOTE — ED Triage Notes (Signed)
Pt states he woke with a rash on his upper body 5 days ago. 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 Denies and chemical exposure.  Tried itch creams with no luck.

## 2020-06-09 ENCOUNTER — Encounter (HOSPITAL_COMMUNITY): Payer: Self-pay | Admitting: Emergency Medicine

## 2020-06-09 ENCOUNTER — Other Ambulatory Visit: Payer: Self-pay

## 2020-06-09 ENCOUNTER — Emergency Department (HOSPITAL_COMMUNITY)
Admission: EM | Admit: 2020-06-09 | Discharge: 2020-06-09 | Disposition: A | Discharge status: Home / Self Care | Payer: Medicaid Other | Attending: Emergency Medicine | Admitting: Emergency Medicine

## 2020-06-09 DIAGNOSIS — L739 Follicular disorder, unspecified: Secondary | ICD-10-CM

## 2020-06-09 DIAGNOSIS — B354 Tinea corporis: Secondary | ICD-10-CM | POA: Insufficient documentation

## 2020-06-09 DIAGNOSIS — L662 Folliculitis decalvans: Secondary | ICD-10-CM | POA: Insufficient documentation

## 2020-06-09 DIAGNOSIS — B359 Dermatophytosis, unspecified: Secondary | ICD-10-CM

## 2020-06-09 DIAGNOSIS — F17210 Nicotine dependence, cigarettes, uncomplicated: Secondary | ICD-10-CM | POA: Insufficient documentation

## 2020-06-09 DIAGNOSIS — Z79899 Other long term (current) drug therapy: Secondary | ICD-10-CM | POA: Insufficient documentation

## 2020-06-09 MED ORDER — FLUCONAZOLE 200 MG PO TABS: 200.0000 mg | ORAL_TABLET | Freq: Every day | ORAL | 0 refills | Status: DC

## 2020-06-09 MED ORDER — DOXYCYCLINE HYCLATE 100 MG PO CAPS: 100.0000 mg | ORAL_CAPSULE | Freq: Two times a day (BID) | ORAL | 0 refills | Status: DC

## 2020-06-09 MED ORDER — PERMETHRIN 5 % EX CREA: TOPICAL_CREAM | CUTANEOUS | 0 refills | Status: DC

## 2020-06-09 NOTE — ED Notes (Signed)
Pt ambulatory to waiting room. Pt verbalized understanding of discharge instructions.   

## 2020-06-09 NOTE — ED Triage Notes (Addendum)
Pt c/o "hives" that have been "popping up" since July. States he was was here 04/14/20 but left due to wait. States that it keeps getting worse and has not seen a PCP for it. Pt states "he feels things moving under his skin causing the blisters and he wants blood work to test it."

## 2020-06-09 NOTE — ED Provider Notes (Signed)
[facility] EMERGENCY DEPARTMENT Provider Note   CSN: [id number] Arrival date & time: 06/09/20  1517     History Chief Complaint  Patient presents with  . Rash    Isaac Daniel is a 34 y.o. male.  Patient presents to the emergency department for evaluation of skin rash.  Patient reports that he has been noticing a rash that keeps popping up on his entire body since July.  It starts as a small bump and then spreads out, develops a scab.  The area is itchy and at times has some burning pain.  He has tried hydrocortisone without improvement.  Patient reports that the areas are worsening and feels like something is crawling under his skin now.        History reviewed. No pertinent past medical history.  Patient Active Problem List   Diagnosis Date Noted  . Pancreatitis, alcoholic, acute 02/24/2015  . 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 Alcohol use 02/24/2015    Past Surgical History:  Procedure Laterality Date  . APPENDECTOMY         Family History  Problem Relation Age of Onset  . Diabetes Maternal Grandmother   . Diabetes Paternal Grandmother     Social History   Tobacco Use  . Smoking status: Current Every Day Smoker    Packs/day: 0.50    Types: Cigarettes  . Smokeless tobacco: Never Used  Substance Use Topics  . Alcohol use: Not Currently  . Drug use: Not Currently    Home Medications Prior to Admission medications   Medication Sig Start Date End Date Taking? Authorizing Provider  diclofenac (VOLTAREN) 75 MG EC tablet Take 1 tablet (75 mg total) by mouth 2 (two) times daily. 05/25/17   Elson Areas, PA-C  methocarbamol (ROBAXIN) 500 MG tablet Take 1 tablet (500 mg total) by mouth 2 (two) times daily. 05/25/17   Elson Areas, PA-C    Allergies    Patient has no known allergies.  Review of Systems   Review of Systems  Skin: Positive for rash.  All other systems reviewed and are negative.   Physical Exam Updated Vital Signs BP (!)  139/101   Pulse (!) 103   Temp 97.7 F (36.5 C)   Resp 18   Ht 6' (1.829 m)   Wt 79.4 kg   SpO2 95%   BMI 23.73 kg/m   Physical Exam Vitals and nursing note reviewed.  Constitutional:      General: He is not in acute distress.    Appearance: Normal appearance. He is well-developed.  HENT:     Head: Normocephalic and atraumatic.     Right Ear: Hearing normal.     Left Ear: Hearing normal.     Nose: Nose normal.  Eyes:     Conjunctiva/sclera: Conjunctivae normal.     Pupils: Pupils are equal, round, and reactive to light.  Cardiovascular:     Rate and Rhythm: Regular rhythm.     Heart sounds: S1 normal and S2 normal. No murmur heard.  No friction rub. No gallop.   Pulmonary:     Effort: Pulmonary effort is normal. No respiratory distress.     Breath sounds: Normal breath sounds.  Chest:     Chest wall: No tenderness.  Abdominal:     General: Bowel sounds are normal.     Palpations: Abdomen is soft.     Tenderness: There is no abdominal tenderness. There is no guarding or rebound. Negative signs include Murphy's sign and McBurney's  sign.     Hernia: No hernia is present.  Musculoskeletal:        General: Normal range of motion.     Cervical back: Normal range of motion and neck supple.  Skin:    General: Skin is warm and dry.     Findings: No rash.     Comments: Multiple lesions all over his body in various stages.  Patient has fading areas that are simply flaky skin but other areas that are raised with central clearing.  Multiple areas are very scabbed over and appear to be excoriations or picking lesions.  Neurological:     Mental Status: He is alert and oriented to person, place, and time.     GCS: GCS eye subscore is 4. GCS verbal subscore is 5. GCS motor subscore is 6.     Cranial Nerves: No cranial nerve deficit.     Sensory: No sensory deficit.     Coordination: Coordination normal.  Psychiatric:        Speech: Speech normal.        Behavior: Behavior normal.         Thought Content: Thought content normal.     ED Results / Procedures / Treatments   Labs (all labs ordered are listed, but only abnormal results are displayed) Labs Reviewed - No data to display  EKG None  Radiology No results found.  Procedures Procedures (including critical care time)  Medications Ordered in ED Medications - No data to display  ED Course  I have reviewed the triage vital signs and the nursing notes.  Pertinent labs & imaging results that were available during my care of the patient were reviewed by me and considered in my medical decision making (see chart for details).    MDM Rules/Calculators/A&P                          Patient with diffuse rash.  Lesions appear to be in various stages, consistent with his stated history that he has been experiencing this rash since July.  He reports that he feels like there are things crawling under his skin.  I believe he has had some picking behavior, multiple lesions are simple deep excoriations with overlying scabs but there are some very circular and raised lesions that have well-demarcated borders and central clearing that raise suspicion for fungus.  Can't rule out MRSA infection.  Will need to cover for fungus as well as bacteria.  He reports no contacts with similar rash but will cover for scabies as well.  Final Clinical Impression(s) / ED Diagnoses Final diagnoses:  Ringworm  Folliculitis    Rx / DC Orders ED Discharge Orders    None       Kewon Statler, Canary Brim, MD 06/09/20 [phone]

## 2020-08-07 ENCOUNTER — Encounter (HOSPITAL_COMMUNITY): Payer: Self-pay | Admitting: *Deleted

## 2020-08-07 ENCOUNTER — Inpatient Hospital Stay (HOSPITAL_COMMUNITY)
Admission: EM | Admit: 2020-08-07 | Discharge: 2020-08-10 | DRG: 439 | Disposition: A | Discharge status: Home / Self Care | Payer: Self-pay | Attending: Internal Medicine | Admitting: Internal Medicine

## 2020-08-07 ENCOUNTER — Emergency Department (HOSPITAL_COMMUNITY): Payer: Self-pay

## 2020-08-07 ENCOUNTER — Other Ambulatory Visit: Payer: Self-pay

## 2020-08-07 DIAGNOSIS — K8521 Alcohol induced acute pancreatitis with uninfected necrosis: Principal | ICD-10-CM

## 2020-08-07 DIAGNOSIS — Y900 Blood alcohol level of less than 20 mg/100 ml: Secondary | ICD-10-CM | POA: Diagnosis present

## 2020-08-07 DIAGNOSIS — F1010 Alcohol abuse, uncomplicated: Secondary | ICD-10-CM

## 2020-08-07 DIAGNOSIS — D649 Anemia, unspecified: Secondary | ICD-10-CM | POA: Diagnosis present

## 2020-08-07 DIAGNOSIS — Z20822 Contact with and (suspected) exposure to covid-19: Secondary | ICD-10-CM | POA: Diagnosis present

## 2020-08-07 DIAGNOSIS — E869 Volume depletion, unspecified: Secondary | ICD-10-CM | POA: Diagnosis present

## 2020-08-07 DIAGNOSIS — E871 Hypo-osmolality and hyponatremia: Secondary | ICD-10-CM | POA: Diagnosis present

## 2020-08-07 DIAGNOSIS — Z720 Tobacco use: Secondary | ICD-10-CM

## 2020-08-07 DIAGNOSIS — E876 Hypokalemia: Secondary | ICD-10-CM | POA: Diagnosis present

## 2020-08-07 DIAGNOSIS — D696 Thrombocytopenia, unspecified: Secondary | ICD-10-CM

## 2020-08-07 DIAGNOSIS — K859 Acute pancreatitis without necrosis or infection, unspecified: Secondary | ICD-10-CM | POA: Diagnosis present

## 2020-08-07 DIAGNOSIS — K5903 Drug induced constipation: Secondary | ICD-10-CM | POA: Diagnosis not present

## 2020-08-07 DIAGNOSIS — T402X5A Adverse effect of other opioids, initial encounter: Secondary | ICD-10-CM | POA: Diagnosis not present

## 2020-08-07 DIAGNOSIS — F17210 Nicotine dependence, cigarettes, uncomplicated: Secondary | ICD-10-CM | POA: Diagnosis present

## 2020-08-07 DIAGNOSIS — Z9049 Acquired absence of other specified parts of digestive tract: Secondary | ICD-10-CM

## 2020-08-07 DIAGNOSIS — Z79899 Other long term (current) drug therapy: Secondary | ICD-10-CM

## 2020-08-07 HISTORY — DX: Alcohol abuse, uncomplicated: F10.10

## 2020-08-07 LAB — COMPREHENSIVE METABOLIC PANEL
ALT: 22 U/L (ref 0–44)
ALT: 18 U/L (ref 0–44)
AST: 32 U/L (ref 15–41)
AST: 24 U/L (ref 15–41)
Albumin: 4.8 g/dL (ref 3.5–5.0)
Albumin: 4 g/dL (ref 3.5–5.0)
Alkaline Phosphatase: 54 U/L (ref 38–126)
Alkaline Phosphatase: 44 U/L (ref 38–126)
Anion gap: 11 (ref 5–15)
Anion gap: 6 (ref 5–15)
BUN: 10 mg/dL (ref 6–20)
BUN: 7 mg/dL (ref 6–20)
CO2: 25 mmol/L (ref 22–32)
CO2: 22 mmol/L (ref 22–32)
Calcium: 9.2 mg/dL (ref 8.9–10.3)
Calcium: 8.3 mg/dL — ABNORMAL LOW (ref 8.9–10.3)
Chloride: 99 mmol/L (ref 98–111)
Chloride: 105 mmol/L (ref 98–111)
Creatinine, Ser: 0.81 mg/dL (ref 0.61–1.24)
Creatinine, Ser: 0.72 mg/dL (ref 0.61–1.24)
GFR, Estimated: >60 mL/min (ref >60)
GFR, Estimated: >60 mL/min (ref >60)
Glucose, Bld: 104 mg/dL — ABNORMAL HIGH (ref 70–99)
Glucose, Bld: 97 mg/dL (ref 70–99)
Potassium: 3.5 mmol/L (ref 3.5–5.1)
Potassium: 3.6 mmol/L (ref 3.5–5.1)
Sodium: 135 mmol/L (ref 135–145)
Sodium: 133 mmol/L — ABNORMAL LOW (ref 135–145)
Total Bilirubin: 0.8 mg/dL (ref 0.3–1.2)
Total Bilirubin: 0.7 mg/dL (ref 0.3–1.2)
Total Protein: 8.5 g/dL — ABNORMAL HIGH (ref 6.5–8.1)
Total Protein: 7.2 g/dL (ref 6.5–8.1)

## 2020-08-07 LAB — CBC WITH DIFFERENTIAL/PLATELET
Abs Immature Granulocytes: 0.05 10*3/uL (ref 0.00–0.07)
Basophils Absolute: 0 10*3/uL (ref 0.0–0.1)
Basophils Relative: 0 %
Eosinophils Absolute: 0 10*3/uL (ref 0.0–0.5)
Eosinophils Relative: 0 %
HCT: 45.8 % (ref 39.0–52.0)
Hemoglobin: 15.6 g/dL (ref 13.0–17.0)
Immature Granulocytes: 1 %
Lymphocytes Relative: 8 %
Lymphs Abs: 0.9 10*3/uL (ref 0.7–4.0)
MCH: 33.2 pg (ref 26.0–34.0)
MCHC: 34.1 g/dL (ref 30.0–36.0)
MCV: 97.4 fL (ref 80.0–100.0)
Monocytes Absolute: 0.6 10*3/uL (ref 0.1–1.0)
Monocytes Relative: 5 %
Neutro Abs: 8.9 10*3/uL — ABNORMAL HIGH (ref 1.7–7.7)
Neutrophils Relative %: 86 %
Platelets: 159 10*3/uL (ref 150–400)
RBC: 4.7 MIL/uL (ref 4.22–5.81)
RDW: 14.1 % (ref 11.5–15.5)
WBC: 10.4 10*3/uL (ref 4.0–10.5)
nRBC: 0 % (ref 0.0–0.2)

## 2020-08-07 LAB — CBC
HCT: 40 % (ref 39.0–52.0)
Hemoglobin: 14 g/dL (ref 13.0–17.0)
MCH: 33.4 pg (ref 26.0–34.0)
MCHC: 35 g/dL (ref 30.0–36.0)
MCV: 95.5 fL (ref 80.0–100.0)
Platelets: 141 10*3/uL — ABNORMAL LOW (ref 150–400)
RBC: 4.19 MIL/uL — ABNORMAL LOW (ref 4.22–5.81)
RDW: 13.9 % (ref 11.5–15.5)
WBC: 8.8 10*3/uL (ref 4.0–10.5)
nRBC: 0 % (ref 0.0–0.2)

## 2020-08-07 LAB — ETHANOL: Alcohol, Ethyl (B): <10 mg/dL (ref <10)

## 2020-08-07 LAB — MAGNESIUM: Magnesium: 1.8 mg/dL (ref 1.7–2.4)

## 2020-08-07 LAB — PHOSPHORUS: Phosphorus: 2.7 mg/dL (ref 2.5–4.6)

## 2020-08-07 LAB — LIPASE, BLOOD: Lipase: 1029 U/L — ABNORMAL HIGH (ref 11–51)

## 2020-08-07 MED ORDER — PANTOPRAZOLE SODIUM 40 MG IV SOLR
40.0000 mg | Freq: Two times a day (BID) | INTRAVENOUS | Status: DC
  Administered 2020-08-07 – 2020-08-09 (×5): 40 mg via INTRAVENOUS
  Filled 2020-08-07 (×6): qty 40

## 2020-08-07 MED ORDER — ONDANSETRON HCL 4 MG/2ML IJ SOLN
4.0000 mg | Freq: Four times a day (QID) | INTRAMUSCULAR | Status: DC | PRN
  Administered 2020-08-08: 4 mg via INTRAVENOUS
  Filled 2020-08-07: qty 2

## 2020-08-07 MED ORDER — SODIUM CHLORIDE 0.9 % IV BOLUS
1000.0000 mL | Freq: Once | INTRAVENOUS | Status: AC
  Administered 2020-08-07: 1000 mL via INTRAVENOUS

## 2020-08-07 MED ORDER — THIAMINE HCL 100 MG PO TABS
100.0000 mg | ORAL_TABLET | Freq: Every day | ORAL | Status: DC
  Administered 2020-08-07 – 2020-08-10 (×4): 100 mg via ORAL
  Filled 2020-08-07 (×4): qty 1

## 2020-08-07 MED ORDER — FOLIC ACID 1 MG PO TABS
1.0000 mg | ORAL_TABLET | Freq: Every day | ORAL | Status: DC
  Administered 2020-08-07 – 2020-08-10 (×4): 1 mg via ORAL
  Filled 2020-08-07 (×4): qty 1

## 2020-08-07 MED ORDER — NICOTINE 21 MG/24HR TD PT24
21.0000 mg | MEDICATED_PATCH | Freq: Every day | TRANSDERMAL | Status: DC
  Administered 2020-08-07 – 2020-08-10 (×4): 21 mg via TRANSDERMAL
  Filled 2020-08-07 (×5): qty 1

## 2020-08-07 MED ORDER — LORAZEPAM 2 MG/ML IJ SOLN
1.0000 mg | INTRAMUSCULAR | Status: DC | PRN
  Administered 2020-08-07 – 2020-08-09 (×5): 2 mg via INTRAVENOUS
  Filled 2020-08-07 (×5): qty 1

## 2020-08-07 MED ORDER — IOHEXOL 300 MG/ML  SOLN
100.0000 mL | Freq: Once | INTRAMUSCULAR | Status: AC | PRN
  Administered 2020-08-07: 100 mL via INTRAVENOUS

## 2020-08-07 MED ORDER — ADULT MULTIVITAMIN W/MINERALS CH
1.0000 | ORAL_TABLET | Freq: Every day | ORAL | Status: DC
  Administered 2020-08-07 – 2020-08-10 (×4): 1 via ORAL
  Filled 2020-08-07 (×4): qty 1

## 2020-08-07 MED ORDER — LORAZEPAM 1 MG PO TABS: 1.0000 mg | ORAL_TABLET | ORAL | Status: DC | PRN

## 2020-08-07 MED ORDER — SODIUM CHLORIDE 0.9 % IV SOLN: INTRAVENOUS | Status: DC

## 2020-08-07 MED ORDER — HYDROMORPHONE HCL 1 MG/ML IJ SOLN
0.5000 mg | Freq: Once | INTRAMUSCULAR | Status: AC
  Administered 2020-08-07: 0.5 mg via INTRAVENOUS
  Filled 2020-08-07: qty 1

## 2020-08-07 MED ORDER — THIAMINE HCL 100 MG/ML IJ SOLN: 100.0000 mg | Freq: Every day | INTRAMUSCULAR | Status: DC

## 2020-08-07 MED ORDER — HYDROMORPHONE HCL 1 MG/ML IJ SOLN
0.5000 mg | INTRAMUSCULAR | Status: DC | PRN
  Administered 2020-08-07 – 2020-08-10 (×15): 0.5 mg via INTRAVENOUS
  Filled 2020-08-07: qty 1
  Filled 2020-08-07 (×9): qty 0.5
  Filled 2020-08-07 (×2): qty 1
  Filled 2020-08-07 (×2): qty 0.5

## 2020-08-07 MED ORDER — PANTOPRAZOLE SODIUM 40 MG IV SOLR
40.0000 mg | Freq: Once | INTRAVENOUS | Status: AC
  Administered 2020-08-07: 40 mg via INTRAVENOUS
  Filled 2020-08-07: qty 40

## 2020-08-07 NOTE — H&P (Signed)
History and Physical    Isaac Daniel UDJ:[id number] DOB: June 21, 1986 DOA: 08/07/2020  I have briefly reviewed the patient's prior medical records in [facility]  PCP: Patient, No Pcp Per  Patient coming from: home  Chief Complaint: abdominal pain, nausea and vomiting  HPI: Isaac Daniel is a 34 y.o. male with medical history significant of heavy alcohol abuse, no other medical problems comes to the hospital with complaints of abdominal pain.  This has been going on for the past couple of days, and started having nausea and vomiting with dark coffee ground material.  Patient reports a similar episode few years back.  He reports he drinks heavily, cannot really quantify but tells me "a lot".  In the last couple of days he only had couple of beers because he was so sick.  He tried to eat a hot dog today and vomited right afterwards, and decided to come to the ER.  He denies any fever or chills.  No chest pain, no shortness of breath.  ED Course: In the ED he is afebrile, slightly hypertensive with blood pressure into the 150s, satting well on room air.  Blood work shows lipase of 1029, CBC has a hemoglobin of 15.6.  His LFTs are normal.  Due to acute pancreatitis we are asked to admit  Review of Systems: All systems reviewed, and apart from HPI, all negative  History reviewed. No pertinent past medical history.  Past Surgical History:  Procedure Laterality Date  . APPENDECTOMY       reports that he has been smoking cigarettes. He has been smoking about 0.50 packs per day. He has never used smokeless tobacco. He reports previous alcohol use. He reports previous drug use.  No Known Allergies  Family History  Problem Relation Age of Onset  . Diabetes Maternal Grandmother   . Diabetes Paternal Grandmother     Prior to Admission medications   Medication Sig Start Date End Date Taking? Authorizing Provider  diclofenac (VOLTAREN) 75 MG EC tablet Take 1 tablet (75 mg total) by mouth 2  (two) times daily. Patient not taking: Reported on 08/07/2020 05/25/17   Elson Areas, PA-C  doxycycline (VIBRAMYCIN) 100 MG capsule Take 1 capsule (100 mg total) by mouth 2 (two) times daily. Patient not taking: Reported on 08/07/2020 06/09/20   Gilda Crease, MD  fluconazole (DIFLUCAN) 200 MG tablet Take 1 tablet (200 mg total) by mouth daily. Patient not taking: Reported on 08/07/2020 06/09/20   Gilda Crease, MD  methocarbamol (ROBAXIN) 500 MG tablet Take 1 tablet (500 mg total) by mouth 2 (two) times daily. Patient not taking: Reported on 08/07/2020 05/25/17   Elson Areas, PA-C  permethrin [place]) 5 % cream Apply to affected area once Patient not taking: Reported on 08/07/2020 06/09/20   Gilda Crease, MD    Physical Exam: Vitals:   08/07/20 1756 08/07/20 1800 08/07/20 1900 08/07/20 2051  BP: (!) 149/112 (!) 149/114 (!) 168/102 (!) 150/107  Pulse: 71 67 91 80  Resp:  16 20 (!) 22  SpO2: 100% 95% 97% 100%  Height:       Constitutional: NAD, calm, comfortable Eyes: PERRL, lids and conjunctivae normal ENMT: Mucous membranes are moist.  Neck: normal, supple Respiratory: clear to auscultation bilaterally, no wheezing, no crackles. Normal respiratory effort.  Cardiovascular: Regular rate and rhythm, no murmurs / rubs / gallops. No extremity edema. Abdomen: Fairly tender to palpation in the epigastric area, mild voluntary guarding, no rebound.  Bowel  sounds positive Musculoskeletal: no clubbing / cyanosis. Normal muscle tone.  Skin: no rashes, lesions, ulcers. No induration Neurologic: Nonfocal, equal strength  Labs on Admission: I have personally reviewed following labs and imaging studies  CBC: Recent Labs  Lab 08/07/20 1742  WBC 10.4  NEUTROABS 8.9*  HGB 15.6  HCT 45.8  MCV 97.4  PLT 159   Basic Metabolic Panel: Recent Labs  Lab 08/07/20 1742  NA 135  K 3.5  CL 99  CO2 25  GLUCOSE 104*  BUN 10  CREATININE 0.81  CALCIUM 9.2   Liver  Function Tests: Recent Labs  Lab 08/07/20 1742  AST 32  ALT 22  ALKPHOS 54  BILITOT 0.8  PROT 8.5*  ALBUMIN 4.8   Coagulation Profile: No results for input(s): INR, PROTIME in the last 168 hours. BNP (last 3 results) No results for input(s): PROBNP in the last 8760 hours. CBG: No results for input(s): GLUCAP in the last 168 hours. Thyroid Function Tests: No results for input(s): TSH, T4TOTAL, FREET4, T3FREE, THYROIDAB in the last 72 hours. Urine analysis:    Component Value Date/Time   COLORURINE YELLOW 02/24/2015 0435   APPEARANCEUR CLEAR 02/24/2015 0435   LABSPEC 1.022 02/24/2015 0435   PHURINE 6.0 02/24/2015 0435   GLUCOSEU NEGATIVE 02/24/2015 0435   HGBUR NEGATIVE 02/24/2015 0435   BILIRUBINUR NEGATIVE 02/24/2015 0435   KETONESUR 40 (A) 02/24/2015 0435   PROTEINUR NEGATIVE 02/24/2015 0435   UROBILINOGEN 1.0 02/24/2015 0435   NITRITE NEGATIVE 02/24/2015 0435   LEUKOCYTESUR NEGATIVE 02/24/2015 0435     Radiological Exams on Admission: CT ABDOMEN PELVIS W CONTRAST  Result Date: 08/07/2020 CLINICAL DATA:  34 year old male with history of central abdominal pain and nausea. Hematemesis. EXAM: CT ABDOMEN AND PELVIS WITH CONTRAST TECHNIQUE: Multidetector CT imaging of the abdomen and pelvis was performed using the standard protocol following bolus administration of intravenous contrast. CONTRAST:  OMNIPAQUE IOHEXOL 300 MG/ML  SOLN COMPARISON:  No priors. FINDINGS: Lower chest: Unremarkable. Hepatobiliary: No suspicious cystic or solid hepatic lesions. No intra or extrahepatic biliary ductal dilatation. Gallbladder is normal in appearance. Pancreas: Extensive peripancreatic fluid and inflammatory changes, Padda bowl with an acute pancreatitis. No definite areas of pancreatic necrosis. No well-defined peripancreatic fluid collection to clearly indicate a pseudocyst at this time. No pancreatic mass. Spleen: Unremarkable. Adrenals/Urinary Tract: Bilateral kidneys and bilateral  adrenal glands are normal in appearance. No hydroureteronephrosis. Urinary bladder is normal in appearance. Stomach/Bowel: Normal appearance of the stomach. No pathologic dilatation of small bowel or colon. Normal appendix. Vascular/Lymphatic: No significant atherosclerotic disease, aneurysm or dissection noted in the abdominal or pelvic vasculature. Portal vein, splenic vein and superior mesenteric vein are all patent at this time. No lymphadenopathy noted in the abdomen or pelvis. Reproductive: Prostate gland and seminal vesicles are unremarkable in appearance. Other: Peripancreatic fluid in the retroperitoneum. No significant volume of ascites. No pneumoperitoneum. Musculoskeletal: There are no aggressive appearing lytic or blastic lesions noted in the visualized portions of the skeleton. IMPRESSION: 1. Findings are compatible with severe acute pancreatitis. No definite pancreatic pseudocyst or overt signs of pancreatic necrosis are noted at this time. Electronically Signed   By: Trudie Reed M.D.   On: 08/07/2020 19:24    Assessment/Plan  Principal Problem Acute alcoholic pancreatitis -Likely in the setting of alcohol abuse, CT scan on admission showed findings of severe pancreatitis without pseudocyst or pancreatic necrosis.  Gallbladder was normal on the CT scan -Start conservative management with aggressive IV fluids, patient very insistent on  eating something will allow clears only, pain control, antiemetics  Active Problems Coffee-ground emesis -Possibly gastritis in the setting of alcohol abuse, hemoglobin is stable.  We will monitor CBC, placed on PPI  Alcohol abuse -Placed on CIWA  Tobacco use -Nicotine patch  DVT prophylaxis: SCDs  Code Status: Full code  Family Communication: no family at bedside  Bed Type: medsurg Consults called: none   Obs/Inp: Obs   Pamella Pert, MD, PhD [facility]  Contact via [email or web address]  08/07/2020, 9:00 PM

## 2020-08-07 NOTE — ED Triage Notes (Signed)
Abdominal pain, states he is vomiting blood

## 2020-08-07 NOTE — ED Notes (Cosign Needed)
Pt says he thinks his girlfriend is poisoning him.   When asked why, pt says, "she is crazy and bipolar."

## 2020-08-07 NOTE — ED Provider Notes (Signed)
[facility] EMERGENCY DEPARTMENT Provider Note   CSN: [id number] Arrival date & time: 08/07/20  1623     History No chief complaint on file.   Isaac Daniel is a 34 y.o. male.  Patient complains of abdominal pain and vomiting blood all day today.  Patient has a history of alcohol abuse.  The history is provided by the patient and medical records. No language interpreter was used.  Abdominal Pain Pain location:  Generalized Pain quality: aching   Pain radiates to:  Does not radiate Pain severity:  Moderate Onset quality:  Sudden Timing:  Constant Progression:  Worsening Chronicity:  Recurrent Context: alcohol use   Associated symptoms: no chest pain, no cough, no diarrhea, no fatigue and no hematuria        History reviewed. No pertinent past medical history.  Patient Active Problem List   Diagnosis Date Noted  . Acute pancreatitis 08/07/2020  . Pancreatitis, alcoholic, acute 02/24/2015  . 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 Alcohol use 02/24/2015    Past Surgical History:  Procedure Laterality Date  . APPENDECTOMY         Family History  Problem Relation Age of Onset  . Diabetes Maternal Grandmother   . Diabetes Paternal Grandmother     Social History   Tobacco Use  . Smoking status: Current Every Day Smoker    Packs/day: 0.50    Types: Cigarettes  . Smokeless tobacco: Never Used  Substance Use Topics  . Alcohol use: Not Currently  . Drug use: Not Currently    Home Medications Prior to Admission medications   Medication Sig Start Date End Date Taking? Authorizing Provider  diclofenac (VOLTAREN) 75 MG EC tablet Take 1 tablet (75 mg total) by mouth 2 (two) times daily. Patient not taking: Reported on 08/07/2020 05/25/17   Elson Areas, PA-C  doxycycline (VIBRAMYCIN) 100 MG capsule Take 1 capsule (100 mg total) by mouth 2 (two) times daily. Patient not taking: Reported on 08/07/2020 06/09/20   Gilda Crease, MD  fluconazole  (DIFLUCAN) 200 MG tablet Take 1 tablet (200 mg total) by mouth daily. Patient not taking: Reported on 08/07/2020 06/09/20   Gilda Crease, MD  methocarbamol (ROBAXIN) 500 MG tablet Take 1 tablet (500 mg total) by mouth 2 (two) times daily. Patient not taking: Reported on 08/07/2020 05/25/17   Elson Areas, PA-C  permethrin (ELIMITE) 5 % cream Apply to affected area once Patient not taking: Reported on 08/07/2020 06/09/20   Gilda Crease, MD    Allergies    Patient has no known allergies.  Review of Systems   Review of Systems  Constitutional: Negative for appetite change and fatigue.  HENT: Negative for congestion, ear discharge and sinus pressure.   Eyes: Negative for discharge.  Respiratory: Negative for cough.   Cardiovascular: Negative for chest pain.  Gastrointestinal: Positive for abdominal pain. Negative for diarrhea.  Genitourinary: Negative for frequency and hematuria.  Musculoskeletal: Negative for back pain.  Skin: Negative for rash.  Neurological: Negative for seizures and headaches.  Psychiatric/Behavioral: Negative for hallucinations.    Physical Exam Updated Vital Signs BP (!) 150/107 (BP Location: Right Arm)   Pulse 80   Resp (!) 22   Ht 6' (1.829 m)   SpO2 100%   BMI 23.73 kg/m   Physical Exam Vitals and nursing note reviewed.  Constitutional:      Appearance: He is well-developed.  HENT:     Head: Normocephalic.  Eyes:     General: No  scleral icterus.    Conjunctiva/sclera: Conjunctivae normal.  Neck:     Thyroid: No thyromegaly.  Cardiovascular:     Rate and Rhythm: Normal rate and regular rhythm.     Heart sounds: No murmur heard.  No friction rub. No gallop.   Pulmonary:     Breath sounds: No stridor. No wheezing or rales.  Chest:     Chest wall: No tenderness.  Abdominal:     General: There is no distension.     Tenderness: There is abdominal tenderness. There is no rebound.  Genitourinary:    Comments: Rectal exam was  heme-negative but no stool was obtained Musculoskeletal:        General: Normal range of motion.     Cervical back: Neck supple.  Lymphadenopathy:     Cervical: No cervical adenopathy.  Skin:    Findings: No erythema or rash.  Neurological:     Mental Status: He is alert and oriented to person, place, and time.     Motor: No abnormal muscle tone.     Coordination: Coordination normal.  Psychiatric:        Behavior: Behavior normal.     ED Results / Procedures / Treatments   Labs (all labs ordered are listed, but only abnormal results are displayed) Labs Reviewed  CBC WITH DIFFERENTIAL/PLATELET - Abnormal; Notable for the following components:      Result Value   Neutro Abs 8.9 (*)    All other components within normal limits  COMPREHENSIVE METABOLIC PANEL - Abnormal; Notable for the following components:   Glucose, Bld 104 (*)    Total Protein 8.5 (*)    All other components within normal limits  LIPASE, BLOOD - Abnormal; Notable for the following components:   Lipase 1,029 (*)    All other components within normal limits  RESPIRATORY PANEL BY RT PCR (FLU A&B, COVID)  ETHANOL  OCCULT BLOOD X 1 CARD TO LAB, STOOL    EKG None  Radiology CT ABDOMEN PELVIS W CONTRAST  Result Date: 08/07/2020 CLINICAL DATA:  34 year old male with history of central abdominal pain and nausea. Hematemesis. EXAM: CT ABDOMEN AND PELVIS WITH CONTRAST TECHNIQUE: Multidetector CT imaging of the abdomen and pelvis was performed using the standard protocol following bolus administration of intravenous contrast. CONTRAST:  OMNIPAQUE IOHEXOL 300 MG/ML  SOLN COMPARISON:  No priors. FINDINGS: Lower chest: Unremarkable. Hepatobiliary: No suspicious cystic or solid hepatic lesions. No intra or extrahepatic biliary ductal dilatation. Gallbladder is normal in appearance. Pancreas: Extensive peripancreatic fluid and inflammatory changes, Padda bowl with an acute pancreatitis. No definite areas of pancreatic  necrosis. No well-defined peripancreatic fluid collection to clearly indicate a pseudocyst at this time. No pancreatic mass. Spleen: Unremarkable. Adrenals/Urinary Tract: Bilateral kidneys and bilateral adrenal glands are normal in appearance. No hydroureteronephrosis. Urinary bladder is normal in appearance. Stomach/Bowel: Normal appearance of the stomach. No pathologic dilatation of small bowel or colon. Normal appendix. Vascular/Lymphatic: No significant atherosclerotic disease, aneurysm or dissection noted in the abdominal or pelvic vasculature. Portal vein, splenic vein and superior mesenteric vein are all patent at this time. No lymphadenopathy noted in the abdomen or pelvis. Reproductive: Prostate gland and seminal vesicles are unremarkable in appearance. Other: Peripancreatic fluid in the retroperitoneum. No significant volume of ascites. No pneumoperitoneum. Musculoskeletal: There are no aggressive appearing lytic or blastic lesions noted in the visualized portions of the skeleton. IMPRESSION: 1. Findings are compatible with severe acute pancreatitis. No definite pancreatic pseudocyst or overt signs of pancreatic necrosis  are noted at this time. Electronically Signed   By: Trudie Reed M.D.   On: 08/07/2020 19:24    Procedures Procedures (including critical care time)  Medications Ordered in ED Medications  HYDROmorphone (DILAUDID) injection 0.5 mg (has no administration in time range)  ondansetron (ZOFRAN) injection 4 mg (has no administration in time range)  pantoprazole (PROTONIX) injection 40 mg (has no administration in time range)  0.9 %  sodium chloride infusion (has no administration in time range)  sodium chloride 0.9 % bolus 1,000 mL (0 mLs Intravenous Stopped 08/07/20 1927)  pantoprazole (PROTONIX) injection 40 mg (40 mg Intravenous Given 08/07/20 1756)  sodium chloride 0.9 % bolus 1,000 mL (1,000 mLs Intravenous New Bag/Given 08/07/20 1927)  HYDROmorphone (DILAUDID) injection 0.5  mg (0.5 mg Intravenous Given 08/07/20 1928)  iohexol (OMNIPAQUE) 300 MG/ML solution 100 mL (100 mLs Intravenous Contrast Given 08/07/20 1852)    ED Course  I have reviewed the triage vital signs and the nursing notes.  Pertinent labs & imaging results that were available during my care of the patient were reviewed by me and considered in my medical decision making (see chart for details). CRITICAL CARE Performed by: Bethann Berkshire Total critical care time: 40 minutes Critical care time was exclusive of separately billable procedures and treating other patients. Critical care was necessary to treat or prevent imminent or life-threatening deterioration. Critical care was time spent personally by me on the following activities: development of treatment plan with patient and/or surrogate as well as nursing, discussions with consultants, evaluation of patient's response to treatment, examination of patient, obtaining history from patient or surrogate, ordering and performing treatments and interventions, ordering and review of laboratory studies, ordering and review of radiographic studies, pulse oximetry and re-evaluation of patient's condition.   MDM Rules/Calculators/A&P                          Patient with acute pancreatitis and gastritis.  He'll be admitted to medicine       This patient presents to the ED for concern of abdominal pain, this involves an extensive number of treatment options, and is a complaint that carries with it a high risk of complications and morbidity.  The differential diagnosis includes gastritis and pancreatitis   Lab Tests:   I Ordered, reviewed, and interpreted labs, which included CBC and chemistries and lipase which shows an elevated lipase of around 1000  Medicines ordered:   I ordered medication normal saline Dilaudid and Zofran  Imaging Studies ordered:   I ordered imaging studies which included CT scan  I independently visualized and  interpreted imaging which showed pancreatitis  Additional history obtained:   Additional history obtained from records  Previous records obtained and reviewed.  Consultations Obtained:   I consulted hospitalist and discussed lab and imaging findings  Reevaluation:  After the interventions stated above, I reevaluated the patient and found mild improvement  Critical Interventions:  .   Final Clinical Impression(s) / ED Diagnoses Final diagnoses:  Alcohol-induced acute pancreatitis with uninfected necrosis    Rx / DC Orders ED Discharge Orders    None       Bethann Berkshire, MD 08/07/20 2102

## 2020-08-08 ENCOUNTER — Encounter (HOSPITAL_COMMUNITY): Payer: Self-pay | Admitting: Internal Medicine

## 2020-08-08 DIAGNOSIS — K852 Alcohol induced acute pancreatitis without necrosis or infection: Secondary | ICD-10-CM

## 2020-08-08 DIAGNOSIS — D696 Thrombocytopenia, unspecified: Secondary | ICD-10-CM

## 2020-08-08 DIAGNOSIS — Z720 Tobacco use: Secondary | ICD-10-CM

## 2020-08-08 DIAGNOSIS — F1010 Alcohol abuse, uncomplicated: Secondary | ICD-10-CM

## 2020-08-08 DIAGNOSIS — K920 Hematemesis: Secondary | ICD-10-CM

## 2020-08-08 LAB — CBC
HCT: 42 % (ref 39.0–52.0)
Hemoglobin: 14.3 g/dL (ref 13.0–17.0)
MCH: 32.9 pg (ref 26.0–34.0)
MCHC: 34 g/dL (ref 30.0–36.0)
MCV: 96.6 fL (ref 80.0–100.0)
Platelets: 117 10*3/uL — ABNORMAL LOW (ref 150–400)
RBC: 4.35 MIL/uL (ref 4.22–5.81)
RDW: 13.6 % (ref 11.5–15.5)
WBC: 8.7 10*3/uL (ref 4.0–10.5)
nRBC: 0 % (ref 0.0–0.2)

## 2020-08-08 LAB — LIPASE, BLOOD: Lipase: 800 U/L — ABNORMAL HIGH (ref 11–51)

## 2020-08-08 LAB — COMPREHENSIVE METABOLIC PANEL
ALT: 16 U/L (ref 0–44)
AST: 21 U/L (ref 15–41)
Albumin: 3.7 g/dL (ref 3.5–5.0)
Alkaline Phosphatase: 43 U/L (ref 38–126)
Anion gap: 7 (ref 5–15)
BUN: 5 mg/dL — ABNORMAL LOW (ref 6–20)
CO2: 23 mmol/L (ref 22–32)
Calcium: 8.5 mg/dL — ABNORMAL LOW (ref 8.9–10.3)
Chloride: 104 mmol/L (ref 98–111)
Creatinine, Ser: 0.59 mg/dL — ABNORMAL LOW (ref 0.61–1.24)
GFR, Estimated: >60 mL/min (ref >60)
Glucose, Bld: 97 mg/dL (ref 70–99)
Potassium: 3.1 mmol/L — ABNORMAL LOW (ref 3.5–5.1)
Sodium: 134 mmol/L — ABNORMAL LOW (ref 135–145)
Total Bilirubin: 0.8 mg/dL (ref 0.3–1.2)
Total Protein: 6.8 g/dL (ref 6.5–8.1)

## 2020-08-08 LAB — LIPID PANEL
Cholesterol: 243 mg/dL — ABNORMAL HIGH (ref 0–200)
HDL: 109 mg/dL (ref >40)
LDL Cholesterol: 121 mg/dL — ABNORMAL HIGH (ref 0–99)
Total CHOL/HDL Ratio: 2.2 RATIO
Triglycerides: 66 mg/dL (ref <150)
VLDL: 13 mg/dL (ref 0–40)

## 2020-08-08 LAB — HIV ANTIBODY (ROUTINE TESTING W REFLEX): HIV Screen 4th Generation wRfx: NONREACTIVE

## 2020-08-08 LAB — RESPIRATORY PANEL BY RT PCR (FLU A&B, COVID)
Influenza A by PCR: NEGATIVE
Influenza B by PCR: NEGATIVE
SARS Coronavirus 2 by RT PCR: NEGATIVE

## 2020-08-08 MED ORDER — POTASSIUM CHLORIDE CRYS ER 20 MEQ PO TBCR
20.0000 meq | EXTENDED_RELEASE_TABLET | Freq: Once | ORAL | Status: AC
  Administered 2020-08-08: 20 meq via ORAL
  Filled 2020-08-08: qty 1

## 2020-08-08 MED ORDER — POTASSIUM CHLORIDE IN NACL 20-0.9 MEQ/L-% IV SOLN
INTRAVENOUS | Status: DC
  Filled 2020-08-08: qty 1000

## 2020-08-08 NOTE — Consult Note (Signed)
@LOGO @   Referring Provider: [facility]  Primary Care Physician:  Patient, No Pcp Per Primary Gastroenterologist:  Dr. (previously unassigned)  Date of Admission: 08/07/2020 Date of Consultation: 08/08/2020  Reason for Consultation:  Hematemesis  HPI:  Isaac Daniel is a 34 y.o. year old male with history of alcohol abuse who presented to the emergency room 11/7 due to abdominal pain and hematemesis.  In the emergency department, the patient was afebrile, hemodynamically stable with oxygen saturation 95% room air.  WBC 10.4, hemoglobin 15.6, platelets 141,000.  BMP showed sodium 135, potassium 3.5 chloride 99, CO2 25, serum creatinine 0.81, BUN 10. Lipase 1,029. The patient was started on IV fluids, IV PPI q12 hours.  CT of the abdomen pelvis shows extensive peripancreatic fluid and inflammatory changes.  There is no pancreatic necrosis or pseudocyst.  This morning, hemoglobin 14.3, hematocrit 42.0, down from 45.8. LFTs remain within normal limits. Lipase 800.   Today he states, he had not drank any alcohol for about 2 days. Then he had 1 beer 2 nights ago and woke up with significant epigastric and periumbilical abdominal pain with nausea and vomiting. 10/10 in severity. Vomited x1 then started vomiting bright red blood. No coffee ground emesis. Reports vomiting bright red blood once before with alcohol poisoning in 2011. No prior EGD.  No vomiting since coming to the hospital. Today abdominal pain can be 9/10 in severity. Intermittent. Feels somewhat improved since coming to the emergency room. Had clear liquids for breakfast and lunch and tolerated this well without any worsening of abdominal pain. No NSAIDs. Diclofenac listed on outpatient med list but denies this. No new medications. In fact, he doesn't take any medications at home. No GERD symptoms.   2 days ago, he had diarrhea. 4 watery BMs. No BM yesterday or today. Continues to pass gas.   No black stools. No known bright  red blood in his stools.   Had dizziness yesterday. No dizziness today.   Past Medical History:  Diagnosis Date  . Alcohol abuse     History reviewed. No pertinent surgical history.  Prior to Admission medications   Medication Sig Start Date End Date Taking? Authorizing Provider  diclofenac (VOLTAREN) 75 MG EC tablet Take 1 tablet (75 mg total) by mouth 2 (two) times daily. Patient not taking: Reported on 08/07/2020 05/25/17   05/27/17, PA-C  doxycycline (VIBRAMYCIN) 100 MG capsule Take 1 capsule (100 mg total) by mouth 2 (two) times daily. Patient not taking: Reported on 08/07/2020 06/09/20   08/09/20, MD  fluconazole (DIFLUCAN) 200 MG tablet Take 1 tablet (200 mg total) by mouth daily. Patient not taking: Reported on 08/07/2020 06/09/20   08/09/20, MD  methocarbamol (ROBAXIN) 500 MG tablet Take 1 tablet (500 mg total) by mouth 2 (two) times daily. Patient not taking: Reported on 08/07/2020 05/25/17   05/27/17, PA-C  permethrin (ELIMITE) 5 % cream Apply to affected area once Patient not taking: Reported on 08/07/2020 06/09/20   08/09/20, MD    Current Facility-Administered Medications  Medication Dose Route Frequency Provider Last Rate Last Admin  . 0.9 % NaCl with KCl 20 mEq/ L  infusion   Intravenous Continuous Tat, David, MD 150 mL/hr at 08/08/20 1155 New Bag at 08/08/20 1155  . folic acid (FOLVITE) tablet 1 mg  1 mg Oral Daily 13/08/21, MD   1 mg at 08/08/20 1146  . HYDROmorphone (DILAUDID) injection 0.5 mg  0.5 mg  Intravenous Q2H PRN Leatha Gilding, MD   0.5 mg at 08/08/20 1205  . LORazepam (ATIVAN) tablet 1-4 mg  1-4 mg Oral Q1H PRN Leatha Gilding, MD       Or  . LORazepam (ATIVAN) injection 1-4 mg  1-4 mg Intravenous Q1H PRN Leatha Gilding, MD   2 mg at 08/08/20 0550  . multivitamin with minerals tablet 1 tablet  1 tablet Oral Daily Leatha Gilding, MD   1 tablet at 08/08/20 1146  . nicotine (NICODERM CQ - dosed  in mg/24 hours) patch 21 mg  21 mg Transdermal Daily Leatha Gilding, MD   21 mg at 08/08/20 1143  . ondansetron (ZOFRAN) injection 4 mg  4 mg Intravenous Q6H PRN Leatha Gilding, MD      . pantoprazole (PROTONIX) injection 40 mg  40 mg Intravenous Q12H Leatha Gilding, MD   40 mg at 08/08/20 1210  . thiamine tablet 100 mg  100 mg Oral Daily Pamella Pert M, MD   100 mg at 08/08/20 1147   Or  . thiamine (B-1) injection 100 mg  100 mg Intravenous Daily Leatha Gilding, MD       Current Outpatient Medications  Medication Sig Dispense Refill  . diclofenac (VOLTAREN) 75 MG EC tablet Take 1 tablet (75 mg total) by mouth 2 (two) times daily. (Patient not taking: Reported on 08/07/2020) 14 tablet 0  . doxycycline (VIBRAMYCIN) 100 MG capsule Take 1 capsule (100 mg total) by mouth 2 (two) times daily. (Patient not taking: Reported on 08/07/2020) 20 capsule 0  . fluconazole (DIFLUCAN) 200 MG tablet Take 1 tablet (200 mg total) by mouth daily. (Patient not taking: Reported on 08/07/2020) 7 tablet 0  . methocarbamol (ROBAXIN) 500 MG tablet Take 1 tablet (500 mg total) by mouth 2 (two) times daily. (Patient not taking: Reported on 08/07/2020) 20 tablet 0  . permethrin (ELIMITE) 5 % cream Apply to affected area once (Patient not taking: Reported on 08/07/2020) 60 g 0    Allergies as of 08/07/2020  . (No Known Allergies)    Family History  Problem Relation Age of Onset  . Diabetes Maternal Grandmother   . Diabetes Paternal Grandmother   . Colon cancer Neg Hx   . Liver disease Neg Hx     Social History   Socioeconomic History  . Marital status: Single    Spouse name: Not on file  . Number of children: Not on file  . Years of education: Not on file  . Highest education level: Not on file  Occupational History  . Not on file  Tobacco Use  . Smoking status: Current Every Day Smoker    Packs/day: 0.50    Types: Cigarettes  . Smokeless tobacco: Never Used  Substance and Sexual Activity  .  Alcohol use: Yes    Comment: 4 40 oz a day  . Drug use: Never  . Sexual activity: Not on file  Other Topics Concern  . Not on file  Social History Narrative  . Not on file   Social Determinants of Health   Financial Resource Strain:   . Difficulty of Paying Living Expenses: Not on file  Food Insecurity:   . Worried About Programme researcher, broadcasting/film/video in the Last Year: Not on file  . Ran Out of Food in the Last Year: Not on file  Transportation Needs:   . Lack of Transportation (Medical): Not on file  . Lack of Transportation (Non-Medical): Not  on file  Physical Activity:   . Days of Exercise per Week: Not on file  . Minutes of Exercise per Session: Not on file  Stress:   . Feeling of Stress : Not on file  Social Connections:   . Frequency of Communication with Friends and Family: Not on file  . Frequency of Social Gatherings with Friends and Family: Not on file  . Attends Religious Services: Not on file  . Active Member of Clubs or Organizations: Not on file  . Attends BankerClub or Organization Meetings: Not on file  . Marital Status: Not on file  Intimate Partner Violence:   . Fear of Current or Ex-Partner: Not on file  . Emotionally Abused: Not on file  . Physically Abused: Not on file  . Sexually Abused: Not on file    Review of Systems:  Gen: Had chills at home. No fever.   CV: Denies chest pain or  heart palpitations Resp: Denies shortness of breath or cough.  GI: See HPI Heme: See HPI  Physical Exam: Vital signs in last 24 hours: Temp:  [98.9 F (37.2 C)] 98.9 F (37.2 C) (11/07 2200) Pulse Rate:  [62-95] 90 (11/08 1300) Resp:  [9-23] 19 (11/08 1300) BP: (137-170)/(97-124) 162/116 (11/08 1300) SpO2:  [95 %-100 %] 100 % (11/08 1300)   General:   Alert,  well-developed, well-nourished, pleasant and cooperative in NAD, sleeping when I entered the room but aroused easily. Head:  Normocephalic and atraumatic. Eyes:  Sclera clear, no icterus.   Conjunctiva pink. Lungs:   Clear throughout to auscultation.   No wheezes, crackles, or rhonchi. No acute distress. Heart:  Regular rate and rhythm; no murmurs, clicks, rubs,  or gallops. Abdomen:  Soft and nondistended. Mild TTP in epigastric area. No masses, hepatosplenomegaly or hernias noted. Normal bowel sounds, without guarding, and without rebound.   Rectal:  Deferred  Msk:  Symmetrical without gross deformities. Normal posture. Extremities:  Without edema. Neurologic:  Alert and  oriented x4;  grossly normal neurologically. Skin:  Intact without significant lesions or rashes. Psych: Normal mood and affect.  Intake/Output from previous day: 11/07 0701 - 11/08 0700 In: 2732.4 [I.V.:1732.4; IV Piggyback:1000] Out: -  Intake/Output this shift: Total I/O In: 1000 [I.V.:1000] Out: -   Lab Results: Recent Labs    08/07/20 1742 08/07/20 2201 08/08/20 0528  WBC 10.4 8.8 8.7  HGB 15.6 14.0 14.3  HCT 45.8 40.0 42.0  PLT 159 141* 117*   BMET Recent Labs    08/07/20 1742 08/07/20 2201 08/08/20 0528  NA 135 133* 134*  K 3.5 3.6 3.1*  CL 99 105 104  CO2 25 22 23   GLUCOSE 104* 97 97  BUN 10 7 5*  CREATININE 0.81 0.72 0.59*  CALCIUM 9.2 8.3* 8.5*   LFT Recent Labs    08/07/20 1742 08/07/20 2201 08/08/20 0528  PROT 8.5* 7.2 6.8  ALBUMIN 4.8 4.0 3.7  AST 32 24 21  ALT 22 18 16   ALKPHOS 54 44 43  BILITOT 0.8 0.7 0.8   Studies/Results: CT ABDOMEN PELVIS W CONTRAST  Result Date: 08/07/2020 CLINICAL DATA:  34 year old male with history of central abdominal pain and nausea. Hematemesis. EXAM: CT ABDOMEN AND PELVIS WITH CONTRAST TECHNIQUE: Multidetector CT imaging of the abdomen and pelvis was performed using the standard protocol following bolus administration of intravenous contrast. CONTRAST:  100mL OMNIPAQUE IOHEXOL 300 MG/ML  SOLN COMPARISON:  No priors. FINDINGS: Lower chest: Unremarkable. Hepatobiliary: No suspicious cystic or solid hepatic lesions. No  intra or extrahepatic biliary ductal  dilatation. Gallbladder is normal in appearance. Pancreas: Extensive peripancreatic fluid and inflammatory changes, Padda bowl with an acute pancreatitis. No definite areas of pancreatic necrosis. No well-defined peripancreatic fluid collection to clearly indicate a pseudocyst at this time. No pancreatic mass. Spleen: Unremarkable. Adrenals/Urinary Tract: Bilateral kidneys and bilateral adrenal glands are normal in appearance. No hydroureteronephrosis. Urinary bladder is normal in appearance. Stomach/Bowel: Normal appearance of the stomach. No pathologic dilatation of small bowel or colon. Normal appendix. Vascular/Lymphatic: No significant atherosclerotic disease, aneurysm or dissection noted in the abdominal or pelvic vasculature. Portal vein, splenic vein and superior mesenteric vein are all patent at this time. No lymphadenopathy noted in the abdomen or pelvis. Reproductive: Prostate gland and seminal vesicles are unremarkable in appearance. Other: Peripancreatic fluid in the retroperitoneum. No significant volume of ascites. No pneumoperitoneum. Musculoskeletal: There are no aggressive appearing lytic or blastic lesions noted in the visualized portions of the skeleton. IMPRESSION: 1. Findings are compatible with severe acute pancreatitis. No definite pancreatic pseudocyst or overt signs of pancreatic necrosis are noted at this time. Electronically Signed   By: Trudie Reed M.D.   On: 08/07/2020 19:24    Impression: 34 year old male with history of alcohol abuse presented to the emergency room 11/72 abdominal pain and hematemesis, found to have acute noncomplicated pancreatitis on CT, no other significant findings. He was hemodynamically stable, hemoglobin 15.6, hematocrit 45.8, BUN 10, creatinine 0.1, lipase 1029.  He was given 1L NS bolus x2 and started on IV fluids, currently at 167mL/hr and IV PPI  BID.   Acute pancreatitis: Likely secondary to chronic alcohol abuse. Will also obtain Lipid panel.  No evidence of gallstones or biliary dilation of CT and LFTs remain within normal limits. Of note, he was also treated for acute pancreatitis in 2016.  Lipase has come down to 800, BUN down to 5, hematocrit down to 42.0.  Clinically, he is improving. Continues with epigastric abdominal pain but is tolerating a clear liquid diet well without nausea or vomiting. We discussed continuing with clear liquids as tolerated for now and will advance diet once his pain improves. Will increase IV fluids to 27mL/hr for the remainder of today and decrease tomorrow morning. Also discussed the importance of alcohol cessation.   Hematemesis: 3-4 episodes of hematemesis on 11/7 prior to ED presentation. Hemoglobin 15.6 in the emergency room on 11/7 with declined to 14.3 this morning.  Per patient, he has not had any further episodes of hematemesis since presenting to the emergency room.  Suspect hematemesis is likely secondary to Mallory-Weiss tear.  In setting of chronic alcohol abuse, cannot rule out esophagitis, gastritis, or PUD.  With no ongoing hematemesis, suspect decline in hemoglobin is likely dilutional with generous IV fluids in the setting of pancreatitis. Will hold off on EGD for now. Recommend continuing PPI BID.   Plan: Continue clear liquids as tolerated for now.  Increase IV fluids to 239ml/hr today. Will decrease tomorrow morning.  Continue judicious pain management per hospitalists.  No plans for EGD at this time unless recurrent hematemesis. Continue IV PPI Q12 hours.  Monitor or return of hematemesis or other overt GI bleeding.  Continue to monitor H/H.  Management of hypokalemia per hospitalists.  Needs to abstain from alcohol.  Avoid all NSAIDs We will continue to follow with you.     LOS: 0 days    08/08/2020, 1:57 PM   Ermalinda Memos, [facility]

## 2020-08-08 NOTE — Progress Notes (Signed)
PROGRESS NOTE  Isaac Daniel KNL:[id number] DOB: December 13, 1985 DOA: 08/07/2020 PCP: Patient, No Pcp Per  Brief History:  34 year old male with no documented past medical history presenting with 1 day history of abdominal pain with nausea, vomiting, and hematemesis.  The patient states that he had approximately 5 episodes of emesis.  He began seeing blood after the second episode of emesis.  Unfortunately, he continues to drink alcohol.  He states that he drinks three to four 40 oz beers on a daily basis.  In addition, he was drinks about 1/5 of vodka 3 times a month.  He denies any illicit drug use.  His last alcohol intake was on the evening of 08/06/2020.  He denies any NSAID use.  Denies any fevers, chills, chest pain, shortness breath, cough, hemoptysis, diarrhea, hematochezia, melena, dysuria.  He continues to smoke 1 pack/day. In the emergency department, the patient was afebrile hemodynamically stable with oxygen saturation 95% room air.  WBC 10.4, hemoglobin 15.6, platelets 141,000.  BMP showed sodium 135, potassium 3.5 chloride 99, CO2 25, serum creatinine 0.81.  The patient was started on IV fluids.  CT of the abdomen pelvis shows extensive peripancreatic fluid and inflammatory changes.  There is no pancreatic necrosis or pseudocyst.  Assessment/Plan: Acute alcoholic pancreatitis -clear liquids for now -Continue IV fluids -Judicious pain control -lipase 1029 at the time of presentation -Check lipids  Hematemesis -Suspect Mallory-Weiss tear versus gastritis/esophagitis -Start Protonix IV twice daily -GI consult -Clear liquid diet  Alcohol abuse -Alcohol withdrawal protocol  Tobacco abuse -NicoDerm patch -Tobacco cessation discussed  Hyponatremia -Secondary to volume depletion -Continue IV fluids  Hypokalemia -Replete -Check magnesium -Add potassium to IV fluids  Thrombocytopenia -Secondary to myelosuppression from the patient's chronic alcohol use -B12 -Folic  acid     Status is: Observation  The patient will require care spanning > 2 midnights and should be moved to inpatient because: IV treatments appropriate due to intensity of illness or inability to take PO  Dispo: The patient is from: Home              Anticipated d/c is to: Home              Anticipated d/c date is: 2 days              Patient currently is not medically stable to d/c.        Family Communication: no  Family at bedside  Consultants:  GI  Code Status:  FULL   DVT Prophylaxis:  SCDs   Procedures: As Listed in Progress Note Above  Antibiotics: None      Subjective: Patient states that he continues to have epigastric abdominal pain although it is a little bit better.  He denies any emesis presently.  He denies any fevers, chills, cough, hemoptysis, chest pain, shortness breath, diarrhea, hematochezia, melena.  Objective: Vitals:   08/08/20 0804 08/08/20 0805 08/08/20 0806 08/08/20 0809  BP: (!) 153/110   (!) 146/105  Pulse: 79 75 75 75  Resp: 10 15 12    Temp:      TempSrc:      SpO2: 100% 100% 100%   Height:        Intake/Output Summary (Last 24 hours) at 08/08/2020 1046 Last data filed at 08/08/2020 0639 Gross per 24 hour  Intake 2732.41 ml  Output --  Net 2732.41 ml   Weight change:  Exam:   General:  Pt is alert, follows  commands appropriately, not in acute distress  HEENT: No icterus, No thrush, No neck mass, [place]/AT  Cardiovascular: RRR, S1/S2, no rubs, no gallops  Respiratory: bibasilar rales. No wheze  Abdomen: Soft/+BS, epigastric tender, non distended, no guarding  Extremities: No edema, No lymphangitis, No petechiae, No rashes, no synovitis   Data Reviewed: I have personally reviewed following labs and imaging studies Basic Metabolic Panel: Recent Labs  Lab 08/07/20 1742 08/07/20 2201 08/08/20 0528  NA 135 133* 134*  K 3.5 3.6 3.1*  CL 99 105 104  CO2 25 22 23   GLUCOSE 104* 97 97  BUN 10 7 5*  CREATININE  0.81 0.72 0.59*  CALCIUM 9.2 8.3* 8.5*  MG  --  1.8  --   PHOS  --  2.7  --    Liver Function Tests: Recent Labs  Lab 08/07/20 1742 08/07/20 2201 08/08/20 0528  AST 32 24 21  ALT 22 18 16   ALKPHOS 54 44 43  BILITOT 0.8 0.7 0.8  PROT 8.5* 7.2 6.8  ALBUMIN 4.8 4.0 3.7   Recent Labs  Lab 08/07/20 1742 08/08/20 0528  LIPASE 1,029* 800*   No results for input(s): AMMONIA in the last 168 hours. Coagulation Profile: No results for input(s): INR, PROTIME in the last 168 hours. CBC: Recent Labs  Lab 08/07/20 1742 08/07/20 2201 08/08/20 0528  WBC 10.4 8.8 8.7  NEUTROABS 8.9*  --   --   HGB 15.6 14.0 14.3  HCT 45.8 40.0 42.0  MCV 97.4 95.5 96.6  PLT 159 141* 117*   Cardiac Enzymes: No results for input(s): CKTOTAL, CKMB, CKMBINDEX, TROPONINI in the last 168 hours. BNP: Invalid input(s): POCBNP CBG: No results for input(s): GLUCAP in the last 168 hours. HbA1C: No results for input(s): HGBA1C in the last 72 hours. Urine analysis:    Component Value Date/Time   COLORURINE YELLOW 02/24/2015 0435   APPEARANCEUR CLEAR 02/24/2015 0435   LABSPEC 1.022 02/24/2015 0435   PHURINE 6.0 02/24/2015 0435   GLUCOSEU NEGATIVE 02/24/2015 0435   HGBUR NEGATIVE 02/24/2015 0435   BILIRUBINUR NEGATIVE 02/24/2015 0435   KETONESUR 40 (A) 02/24/2015 0435   PROTEINUR NEGATIVE 02/24/2015 0435   UROBILINOGEN 1.0 02/24/2015 0435   NITRITE NEGATIVE 02/24/2015 0435   LEUKOCYTESUR NEGATIVE 02/24/2015 0435   Sepsis Labs: @LABRCNTIP (procalcitonin:4,lacticidven:4) ) Recent Results (from the past 240 hour(s))  Respiratory Panel by RT PCR (Flu A&B, Covid) - Nasopharyngeal Swab     Status: None   Collection Time: 08/07/20 10:13 PM   Specimen: Nasopharyngeal Swab  Result Value Ref Range Status   SARS Coronavirus 2 by RT PCR NEGATIVE NEGATIVE Final    Comment: (NOTE) SARS-CoV-2 target nucleic acids are NOT DETECTED.  The SARS-CoV-2 RNA is generally detectable in upper respiratoy specimens  during the acute phase of infection. The lowest concentration of SARS-CoV-2 viral copies this assay can detect is 131 copies/mL. A negative result does not preclude SARS-Cov-2 infection and should not be used as the sole basis for treatment or other patient management decisions. A negative result may occur with  improper specimen collection/handling, submission of specimen other than nasopharyngeal swab, presence of viral mutation(s) within the areas targeted by this assay, and inadequate number of viral copies (<131 copies/mL). A negative result must be combined with clinical observations, patient history, and epidemiological information. The expected result is Negative.  Fact Sheet for Patients:  https://www.moore.com/https://www.fda.gov/media/142436/download  Fact Sheet for Healthcare Providers:  https://www.young.biz/https://www.fda.gov/media/142435/download  This test is no t yet approved or cleared by the [place] FDA and  has been authorized for detection and/or diagnosis of SARS-CoV-2 by FDA under an Emergency Use Authorization (EUA). This EUA will remain  in effect (meaning this test can be used) for the duration of the COVID-19 declaration under Section 564(b)(1) of the Act, 21 U.S.C. section 360bbb-3(b)(1), unless the authorization is terminated or revoked sooner.     Influenza A by PCR NEGATIVE NEGATIVE Final   Influenza B by PCR NEGATIVE NEGATIVE Final    Comment: (NOTE) The Xpert Xpress SARS-CoV-2/FLU/RSV assay is intended as an aid in  the diagnosis of influenza from Nasopharyngeal swab specimens and  should not be used as a sole basis for treatment. Nasal washings and  aspirates are unacceptable for Xpert Xpress SARS-CoV-2/FLU/RSV  testing.  Fact Sheet for Patients: https://www.moore.com/  Fact Sheet for Healthcare Providers: https://www.young.biz/  This test is not yet approved or cleared by the [place] FDA and  has been authorized for detection and/or  diagnosis of SARS-CoV-2 by  FDA under an Emergency Use Authorization (EUA). This EUA will remain  in effect (meaning this test can be used) for the duration of the  Covid-19 declaration under Section 564(b)(1) of the Act, 21  U.S.C. section 360bbb-3(b)(1), unless the authorization is  terminated or revoked. Performed at [facility], [address] [place], [place] [address]      Scheduled Meds: . folic acid  1 mg Oral Daily  . multivitamin with minerals  1 tablet Oral Daily  . nicotine  21 mg Transdermal Daily  . pantoprazole (PROTONIX) IV  40 mg Intravenous Q12H  . thiamine  100 mg Oral Daily   Or  . thiamine  100 mg Intravenous Daily   Continuous Infusions: . sodium chloride 200 mL/hr at 08/08/20 3254    Procedures/Studies: CT ABDOMEN PELVIS W CONTRAST  Result Date: 08/07/2020 CLINICAL DATA:  34 year old male with history of central abdominal pain and nausea. Hematemesis. EXAM: CT ABDOMEN AND PELVIS WITH CONTRAST TECHNIQUE: Multidetector CT imaging of the abdomen and pelvis was performed using the standard protocol following bolus administration of intravenous contrast. CONTRAST:  OMNIPAQUE IOHEXOL 300 MG/ML  SOLN COMPARISON:  No priors. FINDINGS: Lower chest: Unremarkable. Hepatobiliary: No suspicious cystic or solid hepatic lesions. No intra or extrahepatic biliary ductal dilatation. Gallbladder is normal in appearance. Pancreas: Extensive peripancreatic fluid and inflammatory changes, Padda bowl with an acute pancreatitis. No definite areas of pancreatic necrosis. No well-defined peripancreatic fluid collection to clearly indicate a pseudocyst at this time. No pancreatic mass. Spleen: Unremarkable. Adrenals/Urinary Tract: Bilateral kidneys and bilateral adrenal glands are normal in appearance. No hydroureteronephrosis. Urinary bladder is normal in appearance. Stomach/Bowel: Normal appearance of the stomach. No pathologic dilatation of small bowel or colon. Normal appendix.  Vascular/Lymphatic: No significant atherosclerotic disease, aneurysm or dissection noted in the abdominal or pelvic vasculature. Portal vein, splenic vein and superior mesenteric vein are all patent at this time. No lymphadenopathy noted in the abdomen or pelvis. Reproductive: Prostate gland and seminal vesicles are unremarkable in appearance. Other: Peripancreatic fluid in the retroperitoneum. No significant volume of ascites. No pneumoperitoneum. Musculoskeletal: There are no aggressive appearing lytic or blastic lesions noted in the visualized portions of the skeleton. IMPRESSION: 1. Findings are compatible with severe acute pancreatitis. No definite pancreatic pseudocyst or overt signs of pancreatic necrosis are noted at this time. Electronically Signed   By: Trudie Reed M.D.   On: 08/07/2020 19:24    Catarina Hartshorn, DO  [facility]  If 7PM-7AM, please contact night-coverage [email or web address]  Password [email or web address] 08/08/2020, 10:46 AM   LOS: 0 days

## 2020-08-08 NOTE — ED Notes (Signed)
Attempted report x1. 

## 2020-08-09 DIAGNOSIS — K920 Hematemesis: Secondary | ICD-10-CM

## 2020-08-09 DIAGNOSIS — D696 Thrombocytopenia, unspecified: Secondary | ICD-10-CM

## 2020-08-09 DIAGNOSIS — K852 Alcohol induced acute pancreatitis without necrosis or infection: Secondary | ICD-10-CM

## 2020-08-09 LAB — T4, FREE: Free T4: 0.82 ng/dL (ref 0.61–1.12)

## 2020-08-09 LAB — COMPREHENSIVE METABOLIC PANEL
ALT: 11 U/L (ref 0–44)
AST: 15 U/L (ref 15–41)
Albumin: 3.3 g/dL — ABNORMAL LOW (ref 3.5–5.0)
Alkaline Phosphatase: 42 U/L (ref 38–126)
Anion gap: 9 (ref 5–15)
BUN: <5 mg/dL — ABNORMAL LOW (ref 6–20)
CO2: 24 mmol/L (ref 22–32)
Calcium: 8.5 mg/dL — ABNORMAL LOW (ref 8.9–10.3)
Chloride: 103 mmol/L (ref 98–111)
Creatinine, Ser: 0.67 mg/dL (ref 0.61–1.24)
GFR, Estimated: >60 mL/min (ref >60)
Glucose, Bld: 87 mg/dL (ref 70–99)
Potassium: 3.6 mmol/L (ref 3.5–5.1)
Sodium: 136 mmol/L (ref 135–145)
Total Bilirubin: 0.6 mg/dL (ref 0.3–1.2)
Total Protein: 6.4 g/dL — ABNORMAL LOW (ref 6.5–8.1)

## 2020-08-09 LAB — CBC
HCT: 37 % — ABNORMAL LOW (ref 39.0–52.0)
Hemoglobin: 12.4 g/dL — ABNORMAL LOW (ref 13.0–17.0)
MCH: 33.2 pg (ref 26.0–34.0)
MCHC: 33.5 g/dL (ref 30.0–36.0)
MCV: 99.2 fL (ref 80.0–100.0)
Platelets: 111 10*3/uL — ABNORMAL LOW (ref 150–400)
RBC: 3.73 MIL/uL — ABNORMAL LOW (ref 4.22–5.81)
RDW: 14.1 % (ref 11.5–15.5)
WBC: 7.1 10*3/uL (ref 4.0–10.5)
nRBC: 0 % (ref 0.0–0.2)

## 2020-08-09 LAB — FOLATE: Folate: 11.5 ng/mL (ref >5.9)

## 2020-08-09 LAB — VITAMIN B12: Vitamin B-12: 207 pg/mL (ref 180–914)

## 2020-08-09 LAB — MAGNESIUM: Magnesium: 1.7 mg/dL (ref 1.7–2.4)

## 2020-08-09 LAB — TSH: TSH: 2.975 u[IU]/mL (ref 0.350–4.500)

## 2020-08-09 MED ORDER — POLYETHYLENE GLYCOL 3350 17 G PO PACK
17.0000 g | PACK | Freq: Every day | ORAL | Status: DC
  Administered 2020-08-09 – 2020-08-10 (×2): 17 g via ORAL
  Filled 2020-08-09 (×2): qty 1

## 2020-08-09 NOTE — Progress Notes (Signed)
PROGRESS NOTE  Tawanna SoloJulio Picardi ZOX:[id number] DOB: 09/25/1986 DOA: 08/07/2020 PCP: Patient, No Pcp Per  Brief History:  34 year old male with no documented past medical history presenting with 1 day history of abdominal pain with nausea, vomiting, and hematemesis.  The patient states that he had approximately 5 episodes of emesis.  He began seeing blood after the second episode of emesis.  Unfortunately, he continues to drink alcohol.  He states that he drinks three to four 40 oz beers on a daily basis.  In addition, he was drinks about 1/5 of vodka 3 times a month.  He denies any illicit drug use.  His last alcohol intake was on the evening of 08/06/2020.  He denies any NSAID use.  Denies any fevers, chills, chest pain, shortness breath, cough, hemoptysis, diarrhea, hematochezia, melena, dysuria.  He continues to smoke 1 pack/day. In the emergency department, the patient was afebrile hemodynamically stable with oxygen saturation 95% room air.  WBC 10.4, hemoglobin 15.6, platelets 141,000.  BMP showed sodium 135, potassium 3.5 chloride 99, CO2 25, serum creatinine 0.81.  The patient was started on IV fluids.  CT of the abdomen pelvis shows extensive peripancreatic fluid and inflammatory changes.  There is no pancreatic necrosis or pseudocyst.  Assessment/Plan: Acute alcoholic pancreatitis -clear liquids for now>>full liquids -Continue IV fluids -Judicious pain control -lipase 1029 at the time of presentation -Check lipids--triglycerides 66 -overall pain is improving with bowel rest, IVF, IV opioids  Hematemesis -Suspect Mallory-Weiss tear versus gastritis/esophagitis -Start Protonix IV twice daily -GI consult appreciated-->nonoperative managment -advance to full liquid diet  Alcohol abuse -Alcohol withdrawal protocol -no signs of withdrawal  Tobacco abuse -NicoDerm patch -Tobacco cessation discussed  Hyponatremia -Secondary to volume depletion -Continue IV  fluids-->improving  Hypokalemia -Replete -Check magnesium--1.7 -Added potassium to IV fluids  Thrombocytopenia -Secondary to myelosuppression from the patient's chronic alcohol use -B12--207-->start repletion -Folic acid--11.5     Status is: Inpatient  The patient will require care spanning > 2 midnights and should be moved to inpatient because: IV treatments appropriate due to intensity of illness or inability to take PO  Dispo: The patient is from: Home  Anticipated d/c is to: Home  Anticipated d/c date is: 08/10/20  Patient currently is not medically stable to d/c.        Family Communication: no  Family at bedside  Consultants:  GI  Code Status:  FULL   DVT Prophylaxis:  SCDs   Procedures: As Listed in Progress Note Above  Antibiotics: None  Subjective: Patient states abd pain is 50% better.  Denies f/c, cp, sob, /n/v/d.  No dysuria  Objective: Vitals:   08/08/20 2300 08/09/20 0300 08/09/20 0610 08/09/20 1253  BP: (!) 152/112 (!) 136/97 (!) 131/91 133/88  Pulse: 97 89 82 79  Resp: 18 18 18 19   Temp: 98.5 F (36.9 C) 98.3 F (36.8 C) 98.6 F (37 C) 98.5 F (36.9 C)  TempSrc: Oral Oral Oral   SpO2: 100% 99% 99% 100%  Weight:      Height:        Intake/Output Summary (Last 24 hours) at 08/09/2020 1733 Last data filed at 08/09/2020 1300 Gross per 24 hour  Intake 4747.84 ml  Output 2600 ml  Net 2147.84 ml   Weight change:  Exam:   General:  Pt is alert, follows commands appropriately, not in acute distress  HEENT: No icterus, No thrush, No neck mass, [place]/AT  Cardiovascular: RRR, S1/S2, no rubs,  no gallops  Respiratory: CTA bilaterally, no wheezing, no crackles, no rhonchi  Abdomen: Soft/+BS, epigastric tender, non distended, no guarding  Extremities: No edema, No lymphangitis, No petechiae, No rashes, no synovitis   Data Reviewed: I have personally reviewed following labs and  imaging studies Basic Metabolic Panel: Recent Labs  Lab 08/07/20 1742 08/07/20 2201 08/08/20 0528 08/09/20 0644  NA 135 133* 134* 136  K 3.5 3.6 3.1* 3.6  CL 99 105 104 103  CO2 25 22 23 24   GLUCOSE 104* 97 97 87  BUN 10 7 5* <5*  CREATININE 0.81 0.72 0.59* 0.67  CALCIUM 9.2 8.3* 8.5* 8.5*  MG  --  1.8  --  1.7  PHOS  --  2.7  --   --    Liver Function Tests: Recent Labs  Lab 08/07/20 1742 08/07/20 2201 08/08/20 0528 08/09/20 0644  AST 32 24 21 15   ALT 22 18 16 11   ALKPHOS 54 44 43 42  BILITOT 0.8 0.7 0.8 0.6  PROT 8.5* 7.2 6.8 6.4*  ALBUMIN 4.8 4.0 3.7 3.3*   Recent Labs  Lab 08/07/20 1742 08/08/20 0528  LIPASE 1,029* 800*   No results for input(s): AMMONIA in the last 168 hours. Coagulation Profile: No results for input(s): INR, PROTIME in the last 168 hours. CBC: Recent Labs  Lab 08/07/20 1742 08/07/20 2201 08/08/20 0528 08/09/20 0644  WBC 10.4 8.8 8.7 7.1  NEUTROABS 8.9*  --   --   --   HGB 15.6 14.0 14.3 12.4*  HCT 45.8 40.0 42.0 37.0*  MCV 97.4 95.5 96.6 99.2  PLT 159 141* 117* 111*   Cardiac Enzymes: No results for input(s): CKTOTAL, CKMB, CKMBINDEX, TROPONINI in the last 168 hours. BNP: Invalid input(s): POCBNP CBG: No results for input(s): GLUCAP in the last 168 hours. HbA1C: No results for input(s): HGBA1C in the last 72 hours. Urine analysis:    Component Value Date/Time   COLORURINE YELLOW 02/24/2015 0435   APPEARANCEUR CLEAR 02/24/2015 0435   LABSPEC 1.022 02/24/2015 0435   PHURINE 6.0 02/24/2015 0435   GLUCOSEU NEGATIVE 02/24/2015 0435   HGBUR NEGATIVE 02/24/2015 0435   BILIRUBINUR NEGATIVE 02/24/2015 0435   KETONESUR 40 (A) 02/24/2015 0435   PROTEINUR NEGATIVE 02/24/2015 0435   UROBILINOGEN 1.0 02/24/2015 0435   NITRITE NEGATIVE 02/24/2015 0435   LEUKOCYTESUR NEGATIVE 02/24/2015 0435   Sepsis Labs: @LABRCNTIP (procalcitonin:4,lacticidven:4) ) Recent Results (from the past 240 hour(s))  Respiratory Panel by RT PCR (Flu  A&B, Covid) - Nasopharyngeal Swab     Status: None   Collection Time: 08/07/20 10:13 PM   Specimen: Nasopharyngeal Swab  Result Value Ref Range Status   SARS Coronavirus 2 by RT PCR NEGATIVE NEGATIVE Final    Comment: (NOTE) SARS-CoV-2 target nucleic acids are NOT DETECTED.  The SARS-CoV-2 RNA is generally detectable in upper respiratoy specimens during the acute phase of infection. The lowest concentration of SARS-CoV-2 viral copies this assay can detect is 131 copies/mL. A negative result does not preclude SARS-Cov-2 infection and should not be used as the sole basis for treatment or other patient management decisions. A negative result may occur with  improper specimen collection/handling, submission of specimen other than nasopharyngeal swab, presence of viral mutation(s) within the areas targeted by this assay, and inadequate number of viral copies (<131 copies/mL). A negative result must be combined with clinical observations, patient history, and epidemiological information. The expected result is Negative.  Fact Sheet for Patients:  02/26/2015  Fact Sheet for Healthcare Providers:  https://www.young.biz/  This test is no t yet approved or cleared by the [place] and  has been authorized for detection and/or diagnosis of SARS-CoV-2 by FDA under an Emergency Use Authorization (EUA). This EUA will remain  in effect (meaning this test can be used) for the duration of the COVID-19 declaration under Section 564(b)(1) of the Act, 21 U.S.C. section 360bbb-3(b)(1), unless the authorization is terminated or revoked sooner.     Influenza A by PCR NEGATIVE NEGATIVE Final   Influenza B by PCR NEGATIVE NEGATIVE Final    Comment: (NOTE) The Xpert Xpress SARS-CoV-2/FLU/RSV assay is intended as an aid in  the diagnosis of influenza from Nasopharyngeal swab specimens and  should not be used as a sole basis for treatment. Nasal  washings and  aspirates are unacceptable for Xpert Xpress SARS-CoV-2/FLU/RSV  testing.  Fact Sheet for Patients: https://www.moore.com/  Fact Sheet for Healthcare Providers: https://www.young.biz/  This test is not yet approved or cleared by the [place] FDA and  has been authorized for detection and/or diagnosis of SARS-CoV-2 by  FDA under an Emergency Use Authorization (EUA). This EUA will remain  in effect (meaning this test can be used) for the duration of the  Covid-19 declaration under Section 564(b)(1) of the Act, 21  U.S.C. section 360bbb-3(b)(1), unless the authorization is  terminated or revoked. Performed at [facility], [address], [place], [place] [address]      Scheduled Meds: . folic acid  1 mg Oral Daily  . multivitamin with minerals  1 tablet Oral Daily  . nicotine  21 mg Transdermal Daily  . pantoprazole (PROTONIX) IV  40 mg Intravenous Q12H  . polyethylene glycol  17 g Oral Daily  . thiamine  100 mg Oral Daily   Or  . thiamine  100 mg Intravenous Daily   Continuous Infusions:  Procedures/Studies: CT ABDOMEN PELVIS W CONTRAST  Result Date: 08/07/2020 CLINICAL DATA:  34 year old male with history of central abdominal pain and nausea. Hematemesis. EXAM: CT ABDOMEN AND PELVIS WITH CONTRAST TECHNIQUE: Multidetector CT imaging of the abdomen and pelvis was performed using the standard protocol following bolus administration of intravenous contrast. CONTRAST:  OMNIPAQUE IOHEXOL 300 MG/ML  SOLN COMPARISON:  No priors. FINDINGS: Lower chest: Unremarkable. Hepatobiliary: No suspicious cystic or solid hepatic lesions. No intra or extrahepatic biliary ductal dilatation. Gallbladder is normal in appearance. Pancreas: Extensive peripancreatic fluid and inflammatory changes, Padda bowl with an acute pancreatitis. No definite areas of pancreatic necrosis. No well-defined peripancreatic fluid collection to clearly indicate a  pseudocyst at this time. No pancreatic mass. Spleen: Unremarkable. Adrenals/Urinary Tract: Bilateral kidneys and bilateral adrenal glands are normal in appearance. No hydroureteronephrosis. Urinary bladder is normal in appearance. Stomach/Bowel: Normal appearance of the stomach. No pathologic dilatation of small bowel or colon. Normal appendix. Vascular/Lymphatic: No significant atherosclerotic disease, aneurysm or dissection noted in the abdominal or pelvic vasculature. Portal vein, splenic vein and superior mesenteric vein are all patent at this time. No lymphadenopathy noted in the abdomen or pelvis. Reproductive: Prostate gland and seminal vesicles are unremarkable in appearance. Other: Peripancreatic fluid in the retroperitoneum. No significant volume of ascites. No pneumoperitoneum. Musculoskeletal: There are no aggressive appearing lytic or blastic lesions noted in the visualized portions of the skeleton. IMPRESSION: 1. Findings are compatible with severe acute pancreatitis. No definite pancreatic pseudocyst or overt signs of pancreatic necrosis are noted at this time. Electronically Signed   By: Trudie Reed M.D.   On: 08/07/2020 19:24  Catarina Hartshorn, DO  [facility]  If 7PM-7AM, please contact night-coverage [email or web address] Password [email or web address] 08/09/2020, 5:33 PM   LOS: 1 day

## 2020-08-09 NOTE — TOC Initial Note (Addendum)
Transition of Care [facility]) - Initial/Assessment Note    Patient Details  Name: Isaac Daniel MRN: [id number] Date of Birth: January 12, 1986  Transition of Care [facility]) CM/SW Contact:    Karn Cassis, LCSW Phone Number: 08/09/2020, 8:46 AM  Clinical Narrative:  Pt admitted due to acute alcoholic pancreatitis. TOC consulted due to substance use. Pt reports significant abdominal pain, but willing to complete assessment. He states he lives with his girlfriend and works part-time in [employer]eron[employer]. Pt said he typically drinks 3 days a week and has 3 40 oz beers. He admits this is starting to be a problem for him, primarily due to medical issues. Pt indicates his longest period of sobriety was 7 months when he was in jail. He has been to outpatient treatment in the past, but said he didn't pay attention because it was court ordered. Pt plans to "slow down" now. He doesn't feel that any resources are necessary, but did take resources provided by LCSW.   Pt reports he has Medicaid, but can't find his card. LCSW requested financial counselor to check on Medicaid status. Per chart, pt does not have PCP. He plans to call to request new card and is aware that on his card, he should have a PCP listed.                 Addendum: Per Artist, pt only has family planning Medicaid. LCSW discussed with pt who is agreeable to refer to [facility] for PCP. Referral made.   Expected Discharge Plan: Home/Self Care Barriers to Discharge: Continued Medical Work up   Patient Goals and CMS Choice Patient states their goals for this hospitalization and ongoing recovery are:: return home      Expected Discharge Plan and Services Expected Discharge Plan: Home/Self Care In-house Referral: Clinical Social Work     Living arrangements for the past 2 months: Single Family Home                 DME Arranged: N/A DME Agency: NA       HH Arranged: NA HH Agency: NA        Prior Living  Arrangements/Services Living arrangements for the past 2 months: Single Family Home Lives with:: Significant Other Patient language and need for interpreter reviewed:: Yes Do you feel safe going back to the place where you live?: Yes      Need for Family Participation in Patient Care: No (Comment)     Criminal Activity/Legal Involvement Pertinent to Current Situation/Hospitalization: No - Comment as needed  Activities of Daily Living Home Assistive Devices/Equipment: None ADL Screening (condition at time of admission) Patient's cognitive ability adequate to safely complete daily activities?: Yes Is the patient deaf or have difficulty hearing?: No Does the patient have difficulty seeing, even when wearing glasses/contacts?: No Does the patient have difficulty concentrating, remembering, or making decisions?: No Patient able to express need for assistance with ADLs?: Yes Does the patient have difficulty dressing or bathing?: No Independently performs ADLs?: Yes (appropriate for developmental age) Does the patient have difficulty walking or climbing stairs?: No Weakness of Legs: None Weakness of Arms/Hands: None  Permission Sought/Granted                  Emotional Assessment       Orientation: : Oriented to Self, Oriented to Place, Oriented to  Time, Oriented to Situation Alcohol / Substance Use: Alcohol Use, Tobacco Use Psych Involvement: No (comment)  Admission diagnosis:  Acute pancreatitis [K85.90]  Alcohol-induced acute pancreatitis with uninfected necrosis [K85.21] Patient Active Problem List   Diagnosis Date Noted  . Thrombocytopenia (HCC) 08/08/2020  . Tobacco abuse 08/08/2020  . Alcohol abuse 08/08/2020  . Hematemesis with nausea   . Acute pancreatitis 08/07/2020  . Pancreatitis, alcoholic, acute 02/24/2015  . 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 Alcohol use 02/24/2015   PCP:  Patient, No Pcp Per Pharmacy:   [id number] - [place],  [place] - [address] [place] [address] Phone: [phone] Fax: [phone]     Social Determinants of Health (SDOH) Interventions    Readmission Risk Interventions No flowsheet data found.

## 2020-08-09 NOTE — Progress Notes (Signed)
Drank additional 1100 mls and is interested in advancing diet.  Denies nausea but conitnues to complain of some pain.  Had dilaudid three time between 9 pm and 7 am

## 2020-08-09 NOTE — Progress Notes (Signed)
Has had over 500 mls of different clear liquids tonight with no nausea or increased pain.  No signs of alcohol withdrawal except for some anxiousness.

## 2020-08-09 NOTE — Progress Notes (Signed)
Subjective: States he is feeling much better today. Abdominal pain is about 5/10. Tolerating clear liquids well with no worsening abdominal pain. No brbpr or melena. No BM since admission. Feels a little constipated.  He is ready to advance his diet.  Asking if he will discharge tomorrow.  Still asking for pain medication every 2 hours, but this is only for very mild pain.  Objective: Vital signs in last 24 hours: Temp:  [98.3 F (36.8 C)-99 F (37.2 C)] 98.6 F (37 C) (11/09 0610) Pulse Rate:  [82-97] 82 (11/09 0610) Resp:  [16-19] 18 (11/09 0610) BP: (131-162)/(91-116) 131/91 (11/09 0610) SpO2:  [99 %-100 %] 99 % (11/09 0610) Weight:  [74 kg] 74 kg (11/08 1448) Last BM Date: 08/07/20 General:   Alert and oriented, pleasant Head:  Normocephalic and atraumatic. Eyes:  No icterus, sclera clear. Conjuctiva pink.  Abdomen:  Bowel sounds present, soft, non-distended. Minimal TTP in epigastric area. No HSM or hernias noted. No rebound or guarding. No masses appreciated  Msk:  Symmetrical without gross deformities. Normal posture. Extremities:  Without edema. Neurologic:  Alert and  oriented x4;  grossly normal neurologically. Psych:  Normal mood and affect.  Intake/Output from previous day: 11/08 0701 - 11/09 0700 In: 5700 [P.O.:1650; I.V.:4050] Out: 2600 [Urine:2600] Intake/Output this shift: Total I/O In: 360 [P.O.:360] Out: -   Lab Results: Recent Labs    08/07/20 2201 08/08/20 0528 08/09/20 0644  WBC 8.8 8.7 7.1  HGB 14.0 14.3 12.4*  HCT 40.0 42.0 37.0*  PLT 141* 117* 111*   BMET Recent Labs    08/07/20 2201 08/08/20 0528 08/09/20 0644  NA 133* 134* 136  K 3.6 3.1* 3.6  CL 105 104 103  CO2 22 23 24   GLUCOSE 97 97 87  BUN 7 5* <5*  CREATININE 0.72 0.59* 0.67  CALCIUM 8.3* 8.5* 8.5*   LFT Recent Labs    08/07/20 2201 08/08/20 0528 08/09/20 0644  PROT 7.2 6.8 6.4*  ALBUMIN 4.0 3.7 3.3*  AST 24 21 15   ALT 18 16 11   ALKPHOS 44 43 42  BILITOT 0.7  0.8 0.6   Studies/Results: CT ABDOMEN PELVIS W CONTRAST  Result Date: 08/07/2020 CLINICAL DATA:  34 year old male with history of central abdominal pain and nausea. Hematemesis. EXAM: CT ABDOMEN AND PELVIS WITH CONTRAST TECHNIQUE: Multidetector CT imaging of the abdomen and pelvis was performed using the standard protocol following bolus administration of intravenous contrast. CONTRAST:  OMNIPAQUE IOHEXOL 300 MG/ML  SOLN COMPARISON:  No priors. FINDINGS: Lower chest: Unremarkable. Hepatobiliary: No suspicious cystic or solid hepatic lesions. No intra or extrahepatic biliary ductal dilatation. Gallbladder is normal in appearance. Pancreas: Extensive peripancreatic fluid and inflammatory changes, Padda bowl with an acute pancreatitis. No definite areas of pancreatic necrosis. No well-defined peripancreatic fluid collection to clearly indicate a pseudocyst at this time. No pancreatic mass. Spleen: Unremarkable. Adrenals/Urinary Tract: Bilateral kidneys and bilateral adrenal glands are normal in appearance. No hydroureteronephrosis. Urinary bladder is normal in appearance. Stomach/Bowel: Normal appearance of the stomach. No pathologic dilatation of small bowel or colon. Normal appendix. Vascular/Lymphatic: No significant atherosclerotic disease, aneurysm or dissection noted in the abdominal or pelvic vasculature. Portal vein, splenic vein and superior mesenteric vein are all patent at this time. No lymphadenopathy noted in the abdomen or pelvis. Reproductive: Prostate gland and seminal vesicles are unremarkable in appearance. Other: Peripancreatic fluid in the retroperitoneum. No significant volume of ascites. No pneumoperitoneum. Musculoskeletal: There are no aggressive appearing lytic or blastic lesions  noted in the visualized portions of the skeleton. IMPRESSION: 1. Findings are compatible with severe acute pancreatitis. No definite pancreatic pseudocyst or overt signs of pancreatic necrosis are noted at  this time. Electronically Signed   By: Trudie Reed M.D.   On: 08/07/2020 19:24    Assessment: 34 year old male with history of alcohol abuse presented to the emergency room 11/7 with abdominal pain and hematemesis, found to have acute noncomplicated pancreatitis on CT, no other significant findings. He was hemodynamically stable, hemoglobin 15.6, hematocrit 45.8, BUN 10, creatinine 0.1, lipase 1029.  He was given 1L NS bolus x2 and started on IV fluids and PPI BID.   Acute pancreatitis: Likely secondary to chronic alcohol abuse. No evidence of gallstones or biliary dilation of CT and LFTs remain within normal limits. Triglycerides within normal limits.  He has completed more than 24 hours of IV fluid rehydration with appropriate laboratory response including BUN <5, down from 10 on admission, and hematocrit down to 37 from 45.8 on admission.  Clinically, he is feeling better and reports only mild residual epigastric pain with no worsening with clear liquids.  He has continued to receive pain medication every 2 hours, but this is in the setting of very mild pain.  Explained to patient today that the pain medication is there if he needs it, but he should only ask for it if he is having 8-9/10 severity pain.  Ideally, he should not require pain medications at discharge.  He voices understanding.  We will plan to advance his diet and discontinue IV fluids.   Hematemesis with anemia: 3-4 episodes of hematemesis on 11/7 prior to ED presentation. Hemoglobin 15.6 in the emergency room on 11/7.  Hemoglobin has declined to 12.4 this morning with hematocrit down to 37.0.  No further hematemesis since presenting to the emergency room on 11/7.  No BRBPR or melena. Denies NSAIDs.  Suspect initial episodes of hematemesis are likely secondary to Mallory-Weiss tear.  In the setting of chronic alcohol abuse, cannot rule out esophagitis, gastritis, or PUD.  As he has not had any ongoing hematemesis or other overt GI  bleeding, I suspect decline in hemoglobin is likely secondary to aggressive IV fluid hydration.  Plan to discontinue IV fluids today and monitor hemoglobin.  I do not anticipate a need for EGD during hospitalization.  Constipation: Likely secondary to dilaudid. Start MiraLAX 17 g daily.   Plan: Advance to full liquids today.  Discontinue IV fluids.  Continue pain control but discussed with patient the need to only ask for pain medications if he is having 8-9/10 pain.  Continue PPI BID.  No plans for EGD inpatient unless recurrent hematemesis or continued decline in hemoglobin.  Continue to monitor H/H.  MiraLAX 17g daily for mild constipation likely secondary to dilaudid.  Needs abstinence from alcohol moving forward.  Avoid all NSAIDs We will continue to follow with you.     LOS: 1 day    08/09/2020, 11:10 AM   Ermalinda Memos, [facility]

## 2020-08-10 DIAGNOSIS — K8521 Alcohol induced acute pancreatitis with uninfected necrosis: Secondary | ICD-10-CM

## 2020-08-10 DIAGNOSIS — F1010 Alcohol abuse, uncomplicated: Secondary | ICD-10-CM

## 2020-08-10 DIAGNOSIS — D696 Thrombocytopenia, unspecified: Secondary | ICD-10-CM

## 2020-08-10 LAB — COMPREHENSIVE METABOLIC PANEL
ALT: 12 U/L (ref 0–44)
AST: 16 U/L (ref 15–41)
Albumin: 3.6 g/dL (ref 3.5–5.0)
Alkaline Phosphatase: 42 U/L (ref 38–126)
Anion gap: 10 (ref 5–15)
BUN: <5 mg/dL — ABNORMAL LOW (ref 6–20)
CO2: 25 mmol/L (ref 22–32)
Calcium: 8.8 mg/dL — ABNORMAL LOW (ref 8.9–10.3)
Chloride: 100 mmol/L (ref 98–111)
Creatinine, Ser: 0.72 mg/dL (ref 0.61–1.24)
GFR, Estimated: >60 mL/min (ref >60)
Glucose, Bld: 88 mg/dL (ref 70–99)
Potassium: 3.2 mmol/L — ABNORMAL LOW (ref 3.5–5.1)
Sodium: 135 mmol/L (ref 135–145)
Total Bilirubin: 0.4 mg/dL (ref 0.3–1.2)
Total Protein: 7 g/dL (ref 6.5–8.1)

## 2020-08-10 LAB — CBC
HCT: 38.9 % — ABNORMAL LOW (ref 39.0–52.0)
Hemoglobin: 12.9 g/dL — ABNORMAL LOW (ref 13.0–17.0)
MCH: 32.6 pg (ref 26.0–34.0)
MCHC: 33.2 g/dL (ref 30.0–36.0)
MCV: 98.2 fL (ref 80.0–100.0)
Platelets: 144 10*3/uL — ABNORMAL LOW (ref 150–400)
RBC: 3.96 MIL/uL — ABNORMAL LOW (ref 4.22–5.81)
RDW: 13.7 % (ref 11.5–15.5)
WBC: 6.1 10*3/uL (ref 4.0–10.5)
nRBC: 0 % (ref 0.0–0.2)

## 2020-08-10 MED ORDER — MULTI-VITAMIN/MINERALS PO TABS: 1.0000 | ORAL_TABLET | Freq: Every day | ORAL | 2 refills | Status: AC

## 2020-08-10 MED ORDER — TRAMADOL HCL 50 MG PO TABS
50.0000 mg | ORAL_TABLET | Freq: Two times a day (BID) | ORAL | 0 refills | Status: AC | PRN
Start: 2020-08-10 — End: 2021-08-10

## 2020-08-10 MED ORDER — NICOTINE 21 MG/24HR TD PT24: 21.0000 mg | MEDICATED_PATCH | Freq: Every day | TRANSDERMAL | 1 refills | Status: AC

## 2020-08-10 MED ORDER — POTASSIUM CHLORIDE CRYS ER 20 MEQ PO TBCR
40.0000 meq | EXTENDED_RELEASE_TABLET | Freq: Once | ORAL | Status: AC
  Administered 2020-08-10: 40 meq via ORAL
  Filled 2020-08-10: qty 2

## 2020-08-10 MED ORDER — PANTOPRAZOLE SODIUM 40 MG PO TBEC: 40.0000 mg | DELAYED_RELEASE_TABLET | Freq: Two times a day (BID) | ORAL | 1 refills | Status: AC

## 2020-08-10 MED ORDER — ONDANSETRON 8 MG PO TBDP: 8.0000 mg | ORAL_TABLET | Freq: Three times a day (TID) | ORAL | 0 refills | Status: AC | PRN

## 2020-08-10 NOTE — Progress Notes (Signed)
Nsg Discharge Note  Admit Date:  08/07/2020 Discharge date: 08/10/2020   Isaac Daniel to be D/C'd Home per MD order.  AVS completed.  Copy for chart, and copy for patient signed, and dated. Patient/caregiver able to verbalize understanding.  Discharge Medication: Allergies as of 08/10/2020   No Known Allergies     Medication List    STOP taking these medications   diclofenac 75 MG EC tablet Commonly known as: VOLTAREN   doxycycline 100 MG capsule Commonly known as: VIBRAMYCIN   fluconazole 200 MG tablet Commonly known as: DIFLUCAN   methocarbamol 500 MG tablet Commonly known as: ROBAXIN   permethrin 5 % cream Commonly known as: ELIMITE     TAKE these medications   multivitamin with minerals tablet Take 1 tablet by mouth daily.   nicotine 21 mg/24hr patch Commonly known as: NICODERM CQ - dosed in mg/24 hours Place 1 patch (21 mg total) onto the skin daily. Start taking on: August 11, 2020   ondansetron 8 MG disintegrating tablet Commonly known as: Zofran ODT Take 1 tablet (8 mg total) by mouth every 8 (eight) hours as needed for nausea or vomiting.   pantoprazole 40 MG tablet Commonly known as: Protonix Take 1 tablet (40 mg total) by mouth 2 (two) times daily.   traMADol 50 MG tablet Commonly known as: Ultram Take 1 tablet (50 mg total) by mouth every 12 (twelve) hours as needed for severe pain.       Discharge Assessment: Vitals:   08/09/20 2128 08/10/20 0546  BP: (!) 143/105 114/79  Pulse: 78 72  Resp: 19   Temp: 98.5 F (36.9 C) 99 F (37.2 C)  SpO2: 99% 99%   Skin clean, dry and intact without evidence of skin break down, no evidence of skin tears noted. IV catheter discontinued intact. Site without signs and symptoms of complications - no redness or edema noted at insertion site, patient denies c/o pain - only slight tenderness at site.  Dressing with slight pressure applied.  D/c Instructions-Education: Discharge instructions given to  patient/family with verbalized understanding. D/c education completed with patient/family including follow up instructions, medication list, d/c activities limitations if indicated, with other d/c instructions as indicated by MD - patient able to verbalize understanding, all questions fully answered. Patient instructed to return to ED, call 911, or call MD for any changes in condition.  Patient escorted via WC, and D/C home via private auto.  Brandy Hale, LPN 92/08/9416 40:81 PM

## 2020-08-10 NOTE — Discharge Summary (Signed)
Physician Discharge Summary  Isaac Daniel GNO:[id number] DOB: [birth year] DOA: 08/07/2020  PCP: Patient, No Pcp Per  Admit date: 08/07/2020 Discharge date: 08/10/2020  Time spent: 35 minutes  Recommendations for Outpatient Follow-up:  1. Repeat complete metabolic panel to follow electrolytes, renal function and LFTs. 2. Continue assisting patient with tobacco/alcohol complete cessation if needed. 3. Repeat CBC to follow platelets and hemoglobin count.   Discharge Diagnoses:  Active Problems:   Acute pancreatitis   Thrombocytopenia (HCC)   Tobacco abuse   Alcohol abuse Hypokalemia   Discharge Condition: Stable and improved.  Discharged home with instruction to follow-up with PCP in 10 days.  CODE STATUS: Full code.  Diet recommendation: Regular diet.  Filed Weights   08/08/20 1448  Weight: 74 kg    History of present illness:  34 year old male with no documented past medical history presenting with 1 day history of abdominal pain with nausea, vomiting, and hematemesis. The patient states that he had approximately 5 episodes of emesis. He began seeing blood after the second episode of emesis. Unfortunately, he continues to drink alcohol. He states that he drinks three to four 40 ozbeers on a daily basis. In addition, he was drinks about 1/5 of vodka 3 times a month. He denies any illicit drug use. His last alcohol intake was on the evening of 08/06/2020. He denies any NSAID use. Denies any fevers, chills, chest pain, shortness breath, cough, hemoptysis, diarrhea, hematochezia, melena, dysuria. He continues to smoke 1 pack/day. In the emergency department, the patient was afebrile hemodynamically stable with oxygen saturation 95% room air. WBC 10.4, hemoglobin 15.6, platelets 141,000. BMP showed sodium 135, potassium 3.5 chloride 99, CO2 25, serum creatinine 0.81.The patient was started on IV fluids.CT of the abdomen pelvis shows extensive peripancreatic fluid and  inflammatory changes. There is no pancreatic necrosis or pseudocyst.  Hospital Course:  Acute alcoholic pancreatitis -Patient advised to maintain adequate hydration. -lipase 1029at the time of presentation -Checked lipids--triglycerides 66 -overall pain is improved and patient able to tolerate diet without significant discomfort. -Discharged home with prescription of tramadol for severe pain and as needed Zofran for nausea/vomiting. -Patient instructed to stop alcohol consumption and to follow low-fat diet. -Outpatient follow-up with gastroenterologist in 4 weeks has been arranged.   Hematemesis -Suspect Mallory-Weiss tear versus gastritis/esophagitis in the setting of alcohol abuse and emesis. -Hemoglobin 12.9 at discharge -No operative bleeding -Denies further episode of vomiting or severe abdominal pain at discharge -Patient has been kept on PPI and will follow up with gastroenterologist as an outpatient -Repeat CBC at follow-up visit to assess hemoglobin stability -Patient advised to stop alcohol consumption.  Alcohol abuse -Alcohol withdrawal protocol -no signs of withdrawal  Tobacco abuse -NicoDerm patch prescribed at discharge -Tobacco cessation counseling Provided.  Hyponatremia -Secondary to volume depletion alcohol use. -Improved/resolved after fluid resuscitation. -Patient advised to stop alcohol abuse and to maintain adequate hydration.  Hypokalemia -Repleted -Magnesium potassium repleted and essentially within normal limits at discharge. -Check magnesium--1.7 -Added potassium to IV fluids  Thrombocytopenia -Secondary to myelosuppression from the patient's chronic alcohol use -B12--207- -Folic acid--11.5 -Multivitamin supplementation to provide thiamine, folic acid and B12 recommended.  Procedures:  See below for x-ray reports.  Consultations:  Gastroenterology service.  Discharge Exam: Vitals:   08/09/20 2128 08/10/20 0546  BP: (!) 143/105  114/79  Pulse: 78 72  Resp: 19   Temp: 98.5 F (36.9 C) 99 F (37.2 C)  SpO2: 99% 99%    General: Afebrile, no nausea, no vomiting, reports significant  improvement of his abdominal discomfort, feels ready to go home. Cardiovascular: S1 and S2, no rubs, no gallops, no JVD. Respiratory: Clear to auscultation bilaterally. Abdomen: Soft, no distention, positive bowel sounds.  No guarding appreciated on exam. Extremities: No cyanosis or clubbing.  Discharge Instructions   Discharge Instructions    Discharge instructions   Complete by: As directed    Occasions are prescribed Maintain adequate hydration Follow low-fat/soft diet Follow-up with gastroenterology as an outpatient Follow-up with PCP in 10 days Stop the use of alcohol and tobacco abuse.   Increase activity slowly   Complete by: As directed      Allergies as of 08/10/2020   No Known Allergies     Medication List    STOP taking these medications   diclofenac 75 MG EC tablet Commonly known as: VOLTAREN   doxycycline 100 MG capsule Commonly known as: VIBRAMYCIN   fluconazole 200 MG tablet Commonly known as: DIFLUCAN   methocarbamol 500 MG tablet Commonly known as: ROBAXIN   permethrin 5 % cream Commonly known as: ELIMITE     TAKE these medications   multivitamin with minerals tablet Take 1 tablet by mouth daily.   nicotine 21 mg/24hr patch Commonly known as: NICODERM CQ - dosed in mg/24 hours Place 1 patch (21 mg total) onto the skin daily. Start taking on: August 11, 2020   ondansetron 8 MG disintegrating tablet Commonly known as: Zofran ODT Take 1 tablet (8 mg total) by mouth every 8 (eight) hours as needed for nausea or vomiting.   pantoprazole 40 MG tablet Commonly known as: Protonix Take 1 tablet (40 mg total) by mouth 2 (two) times daily.   traMADol 50 MG tablet Commonly known as: Ultram Take 1 tablet (50 mg total) by mouth every 12 (twelve) hours as needed for severe pain.      No  Known Allergies  Follow-up Information    [facility] Follow up.   Why: Will call you to make appointment. If you haven't heard from them by the time you are discharged, please call [facility].  Contact information: [phone]       Marguerita Merlesastaneda Mayorga, Reuel Boomaniel, MD. Schedule an appointment as soon as possible for a visit in 2 week(s).   Specialty: Gastroenterology Contact information: [address] [place] [address] [phone]                The results of significant diagnostics from this hospitalization (including imaging, microbiology, ancillary and laboratory) are listed below for reference.    Significant Diagnostic Studies: CT ABDOMEN PELVIS W CONTRAST  Result Date: 08/07/2020 CLINICAL DATA:  34 year old male with history of central abdominal pain and nausea. Hematemesis. EXAM: CT ABDOMEN AND PELVIS WITH CONTRAST TECHNIQUE: Multidetector CT imaging of the abdomen and pelvis was performed using the standard protocol following bolus administration of intravenous contrast. CONTRAST:  100mL OMNIPAQUE IOHEXOL 300 MG/ML  SOLN COMPARISON:  No priors. FINDINGS: Lower chest: Unremarkable. Hepatobiliary: No suspicious cystic or solid hepatic lesions. No intra or extrahepatic biliary ductal dilatation. Gallbladder is normal in appearance. Pancreas: Extensive peripancreatic fluid and inflammatory changes, Padda bowl with an acute pancreatitis. No definite areas of pancreatic necrosis. No well-defined peripancreatic fluid collection to clearly indicate a pseudocyst at this time. No pancreatic mass. Spleen: Unremarkable. Adrenals/Urinary Tract: Bilateral kidneys and bilateral adrenal glands are normal in appearance. No hydroureteronephrosis. Urinary bladder is normal in appearance. Stomach/Bowel: Normal appearance of the stomach. No pathologic dilatation of small bowel or colon. Normal appendix.  Vascular/Lymphatic: No significant atherosclerotic disease, aneurysm or  dissection noted in the abdominal or pelvic vasculature. Portal vein, splenic vein and superior mesenteric vein are all patent at this time. No lymphadenopathy noted in the abdomen or pelvis. Reproductive: Prostate gland and seminal vesicles are unremarkable in appearance. Other: Peripancreatic fluid in the retroperitoneum. No significant volume of ascites. No pneumoperitoneum. Musculoskeletal: There are no aggressive appearing lytic or blastic lesions noted in the visualized portions of the skeleton. IMPRESSION: 1. Findings are compatible with severe acute pancreatitis. No definite pancreatic pseudocyst or overt signs of pancreatic necrosis are noted at this time. Electronically Signed   By: Trudie Reed M.D.   On: 08/07/2020 19:24    Microbiology: Recent Results (from the past 240 hour(s))  Respiratory Panel by RT PCR (Flu A&B, Covid) - Nasopharyngeal Swab     Status: None   Collection Time: 08/07/20 10:13 PM   Specimen: Nasopharyngeal Swab  Result Value Ref Range Status   SARS Coronavirus 2 by RT PCR NEGATIVE NEGATIVE Final    Comment: (NOTE) SARS-CoV-2 target nucleic acids are NOT DETECTED.  The SARS-CoV-2 RNA is generally detectable in upper respiratoy specimens during the acute phase of infection. The lowest concentration of SARS-CoV-2 viral copies this assay can detect is 131 copies/mL. A negative result does not preclude SARS-Cov-2 infection and should not be used as the sole basis for treatment or other patient management decisions. A negative result may occur with  improper specimen collection/handling, submission of specimen other than nasopharyngeal swab, presence of viral mutation(s) within the areas targeted by this assay, and inadequate number of viral copies (<131 copies/mL). A negative result must be combined with clinical observations, patient history, and epidemiological information. The expected result is Negative.  Fact Sheet for Patients:   https://www.moore.com/  Fact Sheet for Healthcare Providers:  https://www.young.biz/  This test is no t yet approved or cleared by the [place] FDA and  has been authorized for detection and/or diagnosis of SARS-CoV-2 by FDA under an Emergency Use Authorization (EUA). This EUA will remain  in effect (meaning this test can be used) for the duration of the COVID-19 declaration under Section 564(b)(1) of the Act, 21 U.S.C. section 360bbb-3(b)(1), unless the authorization is terminated or revoked sooner.     Influenza A by PCR NEGATIVE NEGATIVE Final   Influenza B by PCR NEGATIVE NEGATIVE Final    Comment: (NOTE) The Xpert Xpress SARS-CoV-2/FLU/RSV assay is intended as an aid in  the diagnosis of influenza from Nasopharyngeal swab specimens and  should not be used as a sole basis for treatment. Nasal washings and  aspirates are unacceptable for Xpert Xpress SARS-CoV-2/FLU/RSV  testing.  Fact Sheet for Patients: https://www.moore.com/  Fact Sheet for Healthcare Providers: https://www.young.biz/  This test is not yet approved or cleared by the [place] FDA and  has been authorized for detection and/or diagnosis of SARS-CoV-2 by  FDA under an Emergency Use Authorization (EUA). This EUA will remain  in effect (meaning this test can be used) for the duration of the  Covid-19 declaration under Section 564(b)(1) of the Act, 21  U.S.C. section 360bbb-3(b)(1), unless the authorization is  terminated or revoked. Performed at [facility], [address] [place], [place] [address]      Labs: Basic Metabolic Panel: Recent Labs  Lab 08/07/20 1742 08/07/20 2201 08/08/20 0528 08/09/20 0644 08/10/20 0508  NA 135 133* 134* 136 135  K 3.5 3.6 3.1* 3.6 3.2*  CL 99 105 104 103 100  CO2 25  22 23 24 25   GLUCOSE 104* 97 97 87 88  BUN 10 7 5* <5* <5*  CREATININE 0.81 0.72 0.59* 0.67 0.72  CALCIUM 9.2  8.3* 8.5* 8.5* 8.8*  MG  --  1.8  --  1.7  --   PHOS  --  2.7  --   --   --    Liver Function Tests: Recent Labs  Lab 08/07/20 1742 08/07/20 2201 08/08/20 0528 08/09/20 0644 08/10/20 0508  AST 32 24 21 15 16   ALT 22 18 16 11 12   ALKPHOS 54 44 43 42 42  BILITOT 0.8 0.7 0.8 0.6 0.4  PROT 8.5* 7.2 6.8 6.4* 7.0  ALBUMIN 4.8 4.0 3.7 3.3* 3.6   Recent Labs  Lab 08/07/20 1742 08/08/20 0528  LIPASE 1,029* 800*   No results for input(s): AMMONIA in the last 168 hours. CBC: Recent Labs  Lab 08/07/20 1742 08/07/20 2201 08/08/20 0528 08/09/20 0644 08/10/20 0508  WBC 10.4 8.8 8.7 7.1 6.1  NEUTROABS 8.9*  --   --   --   --   HGB 15.6 14.0 14.3 12.4* 12.9*  HCT 45.8 40.0 42.0 37.0* 38.9*  MCV 97.4 95.5 96.6 99.2 98.2  PLT 159 141* 117* 111* 144*   Cardiac Enzymes: No results for input(s): CKTOTAL, CKMB, CKMBINDEX, TROPONINI in the last 168 hours. BNP: BNP (last 3 results) No results for input(s): BNP in the last 8760 hours.  ProBNP (last 3 results) No results for input(s): PROBNP in the last 8760 hours.  CBG: No results for input(s): GLUCAP in the last 168 hours.     Signed:  13/08/21 MD.  [facility] 08/10/2020, 12:00 PM

## 2020-08-10 NOTE — Progress Notes (Signed)
GI Inpatient Follow-up Note   Subjective: States his abdominal pain has resolved.  He tolerated a soft breakfast of grits and chicken broth without pain this morning.  Denies any GERD symptoms nausea or vomiting.  Feels well states he is ready to go home. Denies any chronic long term GI symptoms.   Scheduled Inpatient Medications:  . folic acid  1 mg Oral Daily  . multivitamin with minerals  1 tablet Oral Daily  . nicotine  21 mg Transdermal Daily  . pantoprazole (PROTONIX) IV  40 mg Intravenous Q12H  . polyethylene glycol  17 g Oral Daily  . thiamine  100 mg Oral Daily   Or  . thiamine  100 mg Intravenous Daily    Continuous Inpatient Infusions:    PRN Inpatient Medications:  HYDROmorphone (DILAUDID) injection, LORazepam **OR** LORazepam, ondansetron (ZOFRAN) IV  Review of Systems: Constitutional: Weight is stable.  Eyes: No changes in vision. ENT: No oral lesions, sore throat.  GI: see HPI.  Heme/Lymph: No easy bruising.  CV: No chest pain.  GU: No hematuria.  Integumentary: No rashes.  Neuro: No headaches.  Psych: No depression/anxiety.  Endocrine: No heat/cold intolerance.  Allergic/Immunologic: No urticaria.  Resp: No cough, SOB.  Musculoskeletal: No joint swelling.    Physical Examination: BP 114/79 (BP Location: Left Arm)   Pulse 72   Temp 99 F (37.2 C) (Oral)   Resp 19   Ht 6' (1.829 m)   Wt 74 kg   SpO2 99%   BMI 22.13 kg/m  Gen: NAD, alert and oriented x 4 HEENT: PEERLA, EOMI, Neck: supple, no JVD or thyromegaly Chest: CTA bilaterally, no wheezes, crackles, or other adventitious sounds CV: RRR, no m/g/c/r Abd: soft, NT, ND, +BS in all four quadrants; no HSM, guarding, ridigity, or rebound tenderness Ext: no edema, well perfused with 2+ pulses, Skin: no rash or lesions noted Lymph: no LAD  Data: Lab Results  Component Value Date   WBC 6.1 08/10/2020   HGB 12.9 (L) 08/10/2020   HCT 38.9 (L) 08/10/2020   MCV 98.2 08/10/2020   PLT 144 (L)  08/10/2020   Recent Labs  Lab 08/08/20 0528 08/09/20 0644 08/10/20 0508  HGB 14.3 12.4* 12.9*   Lab Results  Component Value Date   NA 135 08/10/2020   K 3.2 (L) 08/10/2020   CL 100 08/10/2020   CO2 25 08/10/2020   BUN <5 (L) 08/10/2020   CREATININE 0.72 08/10/2020   Lab Results  Component Value Date   ALT 12 08/10/2020   AST 16 08/10/2020   ALKPHOS 42 08/10/2020   BILITOT 0.4 08/10/2020   No results for input(s): APTT, INR, PTT in the last 168 hours.    CT on admission 08/07/20 - IMPRESSION: 1. Findings are compatible with severe acute pancreatitis. No definite pancreatic pseudocyst or overt signs of pancreatic necrosis are noted at this time.  Assessment/Plan: Mr. Ralph is a 34 y.o. male admitted on 08/07/2020 for abdominal pain and hematemesis.  History of heavy alcohol abuse.  Found to have noncomplicated pancreatitis on CT with a lipase of 1029   1.  Pancreatitis - likely secondary to alcohol abuse. No evidence of gallstones or bile duct dilation on CT. Lipid panel unremarkable. History of pancreatitis as well in 2016.  We discussed alcohol avoidance at time of discharge.  States he typically drinks every other day and can vary from 1 to 3 40 ounce beers a day.  Discussed soft bland diet that is low in fat  with advancing as tolerated.  2.  Hematemesis-3-4 episodes prior to ED presentation.  Hemoglobin stable and has not had further episodes of vomiting since admission, suspect likely due to Mallory-Weiss tear.  Will continue PPI twice daily.  Hemoglobin stable at 12.9 this morning.   Recommendations:  -Follow-up in clinic 4 weeks.   Case discussed w/ Dr Karilyn Cota Please call with questions or concerns.    Bluford Kaufmann, PA-C [facility]

## 2020-10-03 ENCOUNTER — Other Ambulatory Visit: Payer: Self-pay

## 2020-10-03 ENCOUNTER — Encounter (HOSPITAL_COMMUNITY): Payer: Self-pay | Admitting: Emergency Medicine

## 2020-10-03 ENCOUNTER — Emergency Department (HOSPITAL_COMMUNITY)
Admission: EM | Admit: 2020-10-03 | Discharge: 2020-10-04 | Disposition: A | Discharge status: Home / Self Care | Payer: Medicaid Other | Attending: Emergency Medicine | Admitting: Emergency Medicine

## 2020-10-03 DIAGNOSIS — R1013 Epigastric pain: Secondary | ICD-10-CM | POA: Insufficient documentation

## 2020-10-03 DIAGNOSIS — F17210 Nicotine dependence, cigarettes, uncomplicated: Secondary | ICD-10-CM | POA: Insufficient documentation

## 2020-10-03 DIAGNOSIS — R059 Cough, unspecified: Secondary | ICD-10-CM | POA: Insufficient documentation

## 2020-10-03 NOTE — ED Triage Notes (Signed)
Pt c/o cough and states he needs his pancreas meds refilled. Pt also c/o epigastric pain and smells of etoh.

## 2020-10-03 NOTE — ED Notes (Signed)
Pt is awake & alert sitting up in bed watching TV. Pt denies any discomfort or cough. Pt states he was evicted from his house & the landlord would not let him get his medication or clothes. Pt states he is here to get a refill on his medication.

## 2020-10-04 ENCOUNTER — Emergency Department (HOSPITAL_COMMUNITY)
Admission: EM | Admit: 2020-10-04 | Discharge: 2020-10-04 | Discharge status: Absent without leave | Payer: Medicaid Other

## 2020-10-04 MED ORDER — FAMOTIDINE 20 MG PO TABS
20.0000 mg | ORAL_TABLET | Freq: Once | ORAL | Status: AC
  Administered 2020-10-04: 20 mg via ORAL
  Filled 2020-10-04: qty 1

## 2020-10-04 NOTE — ED Provider Notes (Signed)
[facility] EMERGENCY DEPARTMENT Provider Note   CSN: [id number] Arrival date & time: 10/03/20  2155   Time seen 11:33 PM  History Chief Complaint  Patient presents with  . Cough    Isaac Daniel is a 35 y.o. male.  HPI   Patient states 3 days ago he started having some epigastric abdominal pain that occurs about every 4 hours and lasts about 5 minutes.  It does not radiate into his back or up into his chest.  He states he had vomiting twice today and saw some streaks of blood.  He denies having nausea now.  He states he had diarrhea 2 days ago but had a normal bowel movement today.  He denies fever and states he had a cough 2 days ago but that is also gone now.  He denies chest pain or shortness of breath.  He does admit to drinking a case of beer 4 days ago for New Year's.  He states he does that once or twice a month.  We discussed how he needed to avoid alcohol.  He denies any burning or acid reflux with the vomiting.  He states he was able to eat normally this evening without making his pain worse or having the vomiting return.  He states he was in the hospital in November when his symptoms were much worse and he was discharged home on Protonix which he has now run out of.  He mainly wants to another prescription for Protonix.  Patient states he has had the Pfizer Covid vaccine x3  PCP Patient, No Pcp Per   Past Medical History:  Diagnosis Date  . Alcohol abuse     Patient Active Problem List   Diagnosis Date Noted  . Thrombocytopenia (HCC) 08/08/2020  . Tobacco abuse 08/08/2020  . Alcohol abuse 08/08/2020  . Hematemesis with nausea   . Acute pancreatitis 08/07/2020  . Pancreatitis, alcoholic, acute 02/24/2015  . 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 Alcohol use 02/24/2015    History reviewed. No pertinent surgical history.     Family History  Problem Relation Age of Onset  . Diabetes Maternal Grandmother   . Diabetes Paternal Grandmother   . Colon cancer Neg  Hx   . Liver disease Neg Hx     Social History   Tobacco Use  . Smoking status: Current Every Day Smoker    Packs/day: 0.50    Types: Cigarettes  . Smokeless tobacco: Never Used  Substance Use Topics  . Alcohol use: Yes    Comment: 4 40 oz a day  . 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 Authorizing Provider  Multiple Vitamins-Minerals (MULTIVITAMIN WITH MINERALS) tablet Take 1 tablet by mouth daily. 08/10/20 11/08/20  Vassie Loll, MD  nicotine (NICODERM CQ - DOSED IN MG/24 HOURS) 21 mg/24hr patch Place 1 patch (21 mg total) onto the skin daily. 08/11/20   Vassie Loll, MD  ondansetron (ZOFRAN ODT) 8 MG disintegrating tablet Take 1 tablet (8 mg total) by mouth every 8 (eight) hours as needed for nausea or vomiting. 08/10/20   Vassie Loll, MD  pantoprazole (PROTONIX) 40 MG tablet Take 1 tablet (40 mg total) by mouth 2 (two) times daily. 08/10/20 08/10/21  Vassie Loll, MD  traMADol (ULTRAM) 50 MG tablet Take 1 tablet (50 mg total) by mouth every 12 (twelve) hours as needed for severe pain. 08/10/20 08/10/21  Vassie Loll, MD  Allergies    Patient has no known allergies.  Review of Systems   Review of Systems  All other systems reviewed and are negative.   Physical Exam Updated Vital Signs BP (!) 145/106   Pulse (!) 112   Temp (!) 97.4 F (36.3 C) (Oral)   Resp 18   Ht 6' (1.829 m)   Wt 83.9 kg   SpO2 100%   BMI 25.09 kg/m   Physical Exam Vitals and nursing note reviewed.  Constitutional:      General: He is not in acute distress.    Appearance: Normal appearance. He is normal weight.  HENT:     Head: Normocephalic and atraumatic.     Right Ear: External ear normal.     Left Ear: External ear normal.     Mouth/Throat:     Mouth: Mucous membranes are moist.  Eyes:     Extraocular Movements: Extraocular movements intact.     Conjunctiva/sclera:  Conjunctivae normal.     Pupils: Pupils are equal, round, and reactive to light.  Cardiovascular:     Rate and Rhythm: Normal rate and regular rhythm.     Pulses: Normal pulses.     Heart sounds: Normal heart sounds. No murmur heard.   Pulmonary:     Effort: Pulmonary effort is normal. No respiratory distress.     Breath sounds: Normal breath sounds.  Chest:     Chest wall: No tenderness.  Abdominal:     General: Bowel sounds are normal. There is no distension.     Palpations: Abdomen is soft.     Tenderness: There is no abdominal tenderness. There is no guarding or rebound.       Comments: Area of pain noted  Musculoskeletal:        General: Normal range of motion.     Cervical back: Normal range of motion and neck supple.  Skin:    General: Skin is warm and dry.     Coloration: Skin is not jaundiced.  Neurological:     General: No focal deficit present.     Mental Status: He is alert and oriented to person, place, and time.     Cranial Nerves: No cranial nerve deficit.  Psychiatric:        Mood and Affect: Mood normal.        Behavior: Behavior normal.        Thought Content: Thought content normal.     ED Results / Procedures / Treatments   Labs (all labs ordered are listed, but only abnormal results are displayed) Labs Reviewed - No data to display  EKG None  Radiology No results found.  Procedures Procedures (including critical care time)  Medications Ordered in ED Medications - No data to display  ED Course  I have reviewed the triage vital signs and the nursing notes.  Pertinent labs & imaging results that were available during my care of the patient were reviewed by me and considered in my medical decision making (see chart for details).    MDM Rules/Calculators/A&P                         Patient does not want to be tested for Covid.  His main concern is getting back on a PPI.  He does not have a primary care doctor.  He was advised what he can  take over-the-counter so he does not need to keep coming back to the emergency department  to get prescriptions.  We also discussed he needs to cut out the alcohol.    Final Clinical Impression(s) / ED Diagnoses Final diagnoses:  Epigastric abdominal pain    Rx / DC Orders ED Discharge Orders    None    OTC PPI   Plan discharge  Devoria Albe, MD, Concha Pyo, MD 10/04/20 [phone]

## 2020-10-04 NOTE — Discharge Instructions (Addendum)
You can take Prevacid (lansoprazole), Nexium (esomeprazole) or prilosec (ompeprazole) OTC in place of the Protonix so that you do not have to see a doctor to get the prescription. YOU NEED TO AVOID DRINKING ANY ALCOHOL or you will get the pancreatitis back bad enough to get you admitted again.  Return to the hospital if you get severe pain or uncontrolled nausea and vomiting.

## 2020-12-09 ENCOUNTER — Other Ambulatory Visit: Payer: Self-pay

## 2020-12-09 ENCOUNTER — Emergency Department (HOSPITAL_COMMUNITY)
Admission: EM | Admit: 2020-12-09 | Discharge: 2020-12-10 | Disposition: A | Discharge status: Home / Self Care | Payer: Self-pay | Attending: Emergency Medicine | Admitting: Emergency Medicine

## 2020-12-09 DIAGNOSIS — S022XXA Fracture of nasal bones, initial encounter for closed fracture: Secondary | ICD-10-CM | POA: Insufficient documentation

## 2020-12-09 DIAGNOSIS — R030 Elevated blood-pressure reading, without diagnosis of hypertension: Secondary | ICD-10-CM | POA: Insufficient documentation

## 2020-12-09 DIAGNOSIS — S022XXD Fracture of nasal bones, subsequent encounter for fracture with routine healing: Secondary | ICD-10-CM

## 2020-12-09 DIAGNOSIS — F17210 Nicotine dependence, cigarettes, uncomplicated: Secondary | ICD-10-CM | POA: Insufficient documentation

## 2020-12-09 DIAGNOSIS — R000 Tachycardia, unspecified: Secondary | ICD-10-CM | POA: Insufficient documentation

## 2020-12-10 ENCOUNTER — Emergency Department (HOSPITAL_COMMUNITY): Payer: Self-pay

## 2020-12-10 ENCOUNTER — Encounter (HOSPITAL_COMMUNITY): Payer: Self-pay

## 2020-12-10 LAB — RAPID URINE DRUG SCREEN, HOSP PERFORMED
Amphetamines: NOT DETECTED
Barbiturates: NOT DETECTED
Benzodiazepines: NOT DETECTED
Cocaine: NOT DETECTED
Opiates: NOT DETECTED
Tetrahydrocannabinol: NOT DETECTED

## 2020-12-10 MED ORDER — LORAZEPAM 1 MG PO TABS
1.0000 mg | ORAL_TABLET | Freq: Once | ORAL | Status: AC
  Administered 2020-12-10: 1 mg via ORAL
  Filled 2020-12-10: qty 1

## 2020-12-10 MED ORDER — CEPHALEXIN 500 MG PO CAPS: 500.0000 mg | ORAL_CAPSULE | Freq: Three times a day (TID) | ORAL | 0 refills | Status: AC

## 2020-12-10 MED ORDER — AMLODIPINE BESYLATE 5 MG PO TABS: 5.0000 mg | ORAL_TABLET | Freq: Every day | ORAL | 0 refills | Status: AC
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
## 2022-10-14 IMAGING — CT CT HEAD W/O CM
3 series · 16 of 47 positions shown, 19 images · non-contrast
Comparison: None.

CLINICAL DATA: Head trauma

EXAM:
CT HEAD WITHOUT CONTRAST
TECHNIQUE: Contiguous axial images were obtained from the base of the skull
through the vertex without intravenous contrast.

[Series 2: head w o · axial · 0.46mm/px · z∈[-71,+54]mm · 10 of 30 slices shown, 13 images]
[im 3/30  brain]
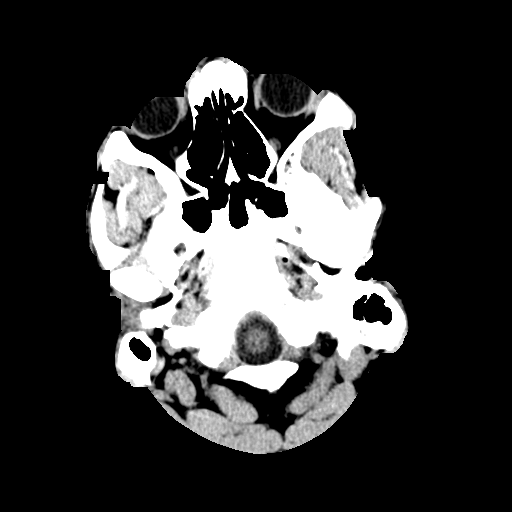
[im 3/30  bone]
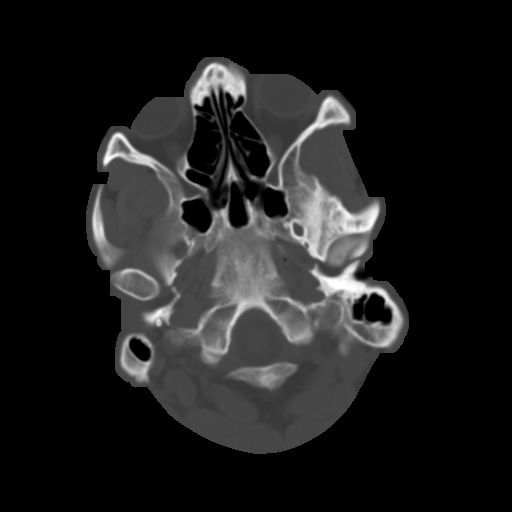
[im 6/30  brain]
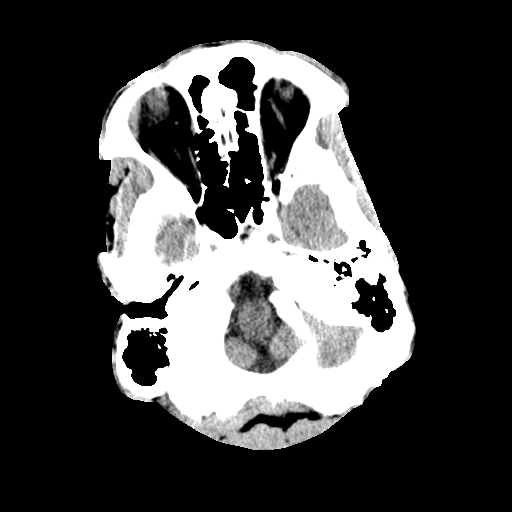
[im 9/30  brain]
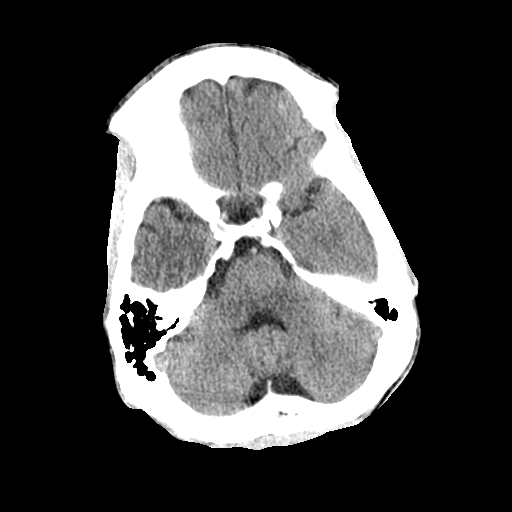
[im 11/30  brain]
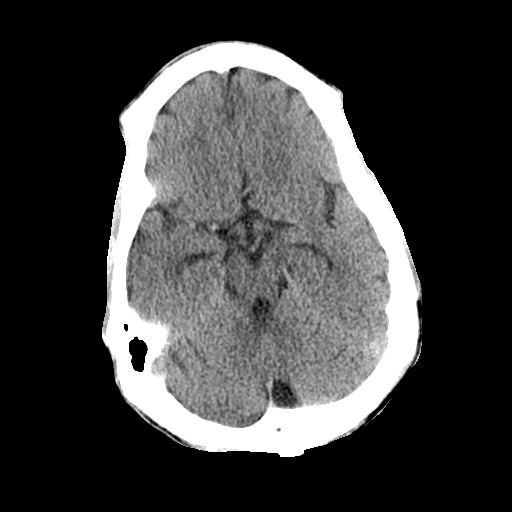
[im 14/30  brain]
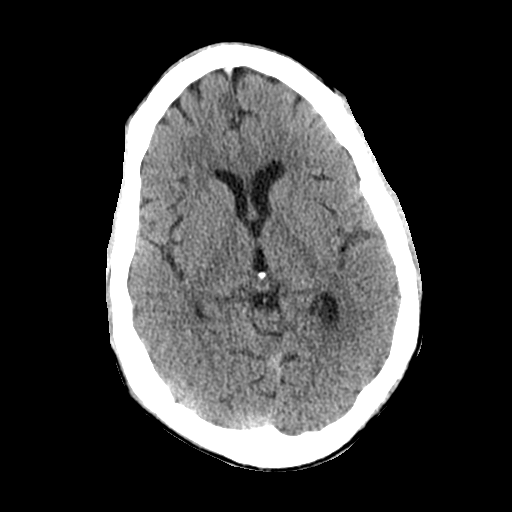
[im 14/30  bone]
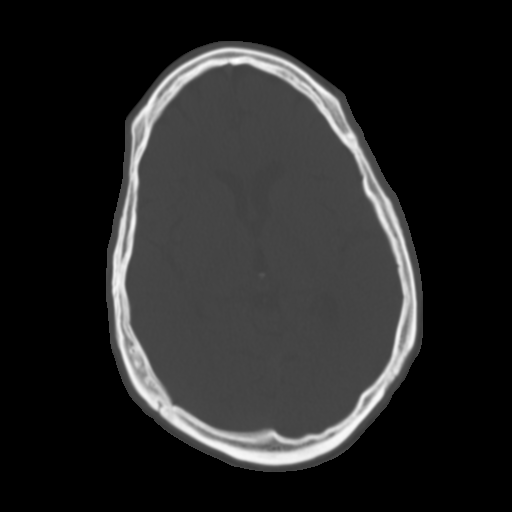
[im 17/30  brain]
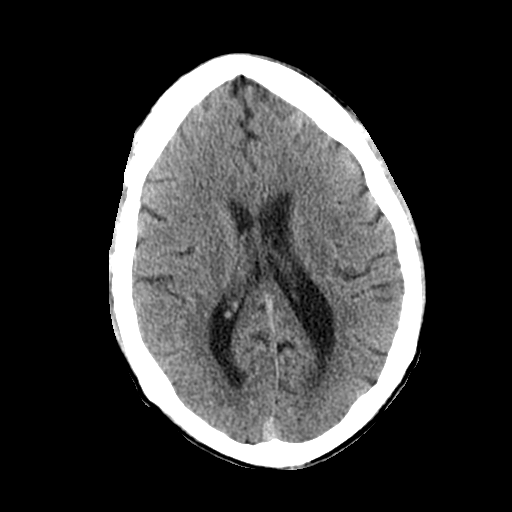
[im 20/30  brain]
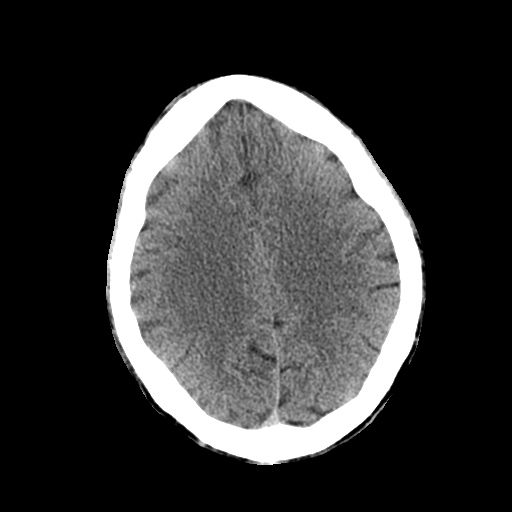
[im 23/30  brain]
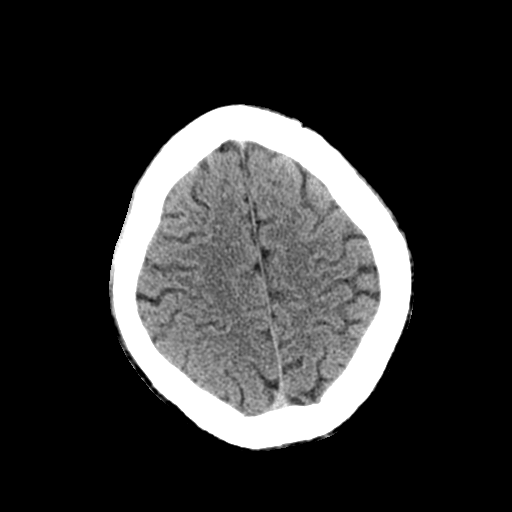
[im 25/30  brain]
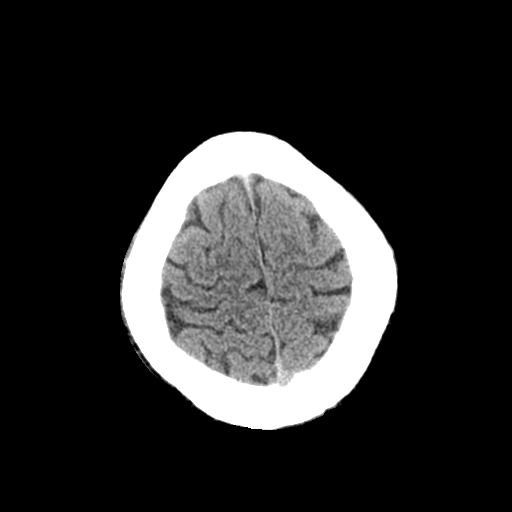
[im 25/30  bone]
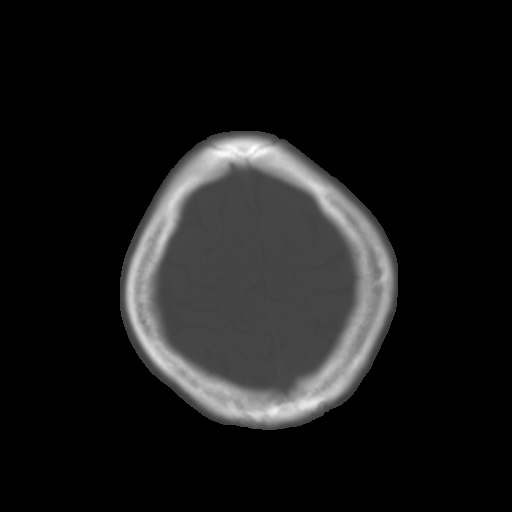
[im 28/30  brain]
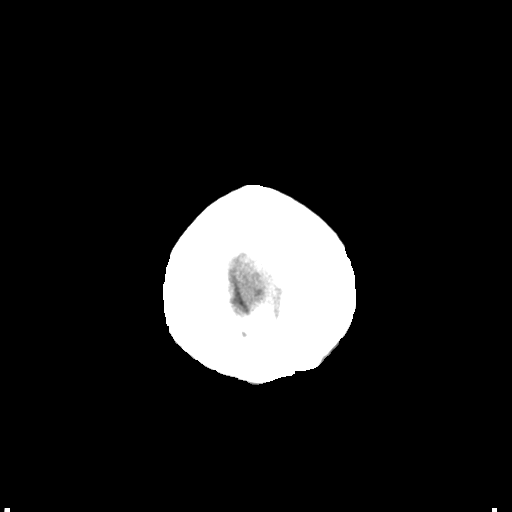

[Series 4: coronal soft · coronal · 0.36mm/px · 3 of 77 slices shown]
[im 26/77  brain]
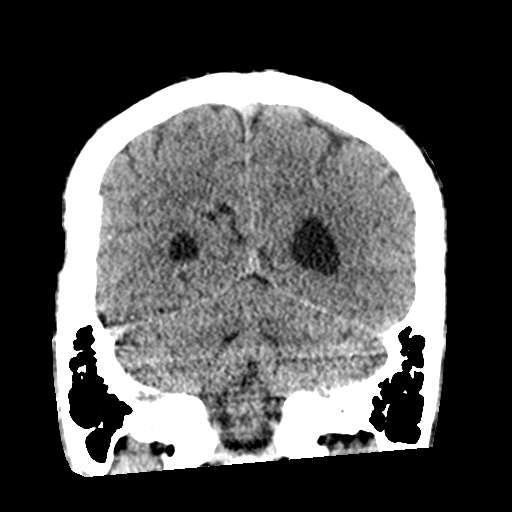
[im 34/77  brain]
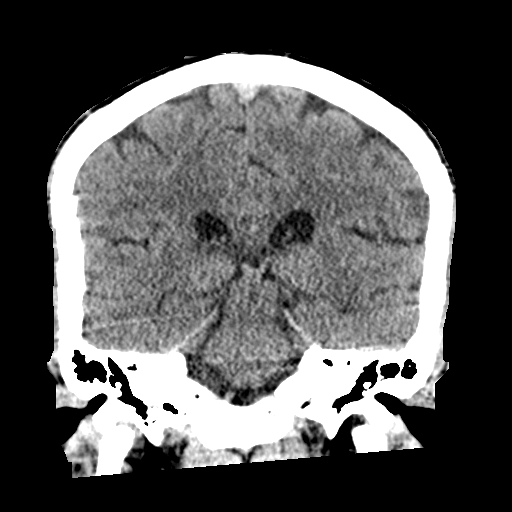
[im 43/77  brain]
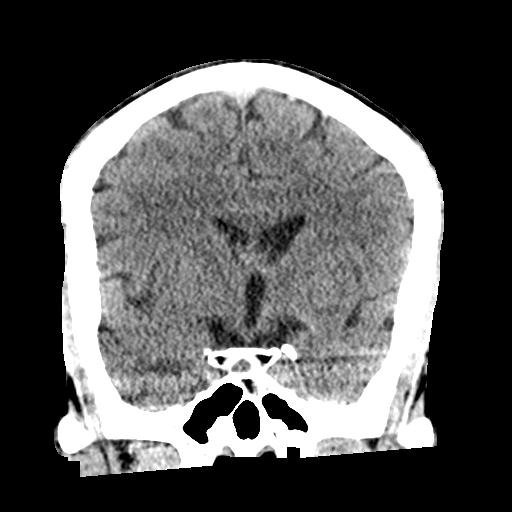

[Series 5: sagittal soft · sagittal · 0.34mm/px · 3 of 57 slices shown]
[im 19/57  brain]
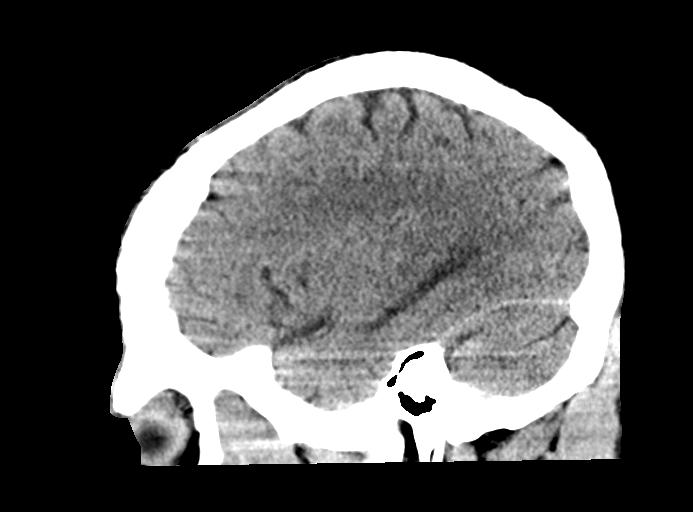
[im 29/57  brain]
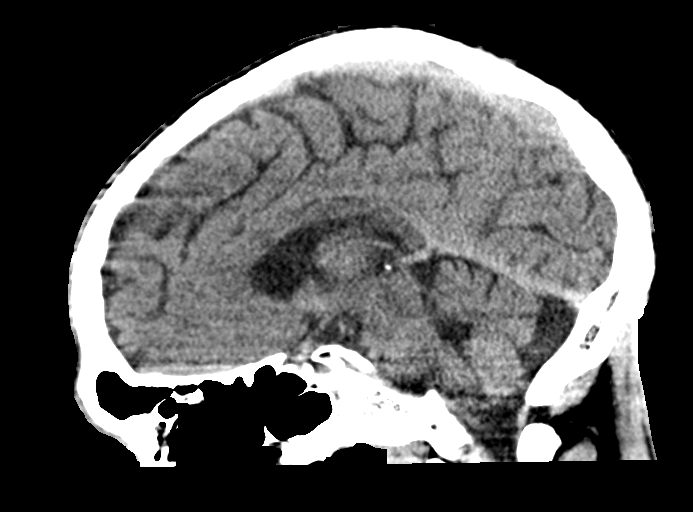
[im 38/57  brain]
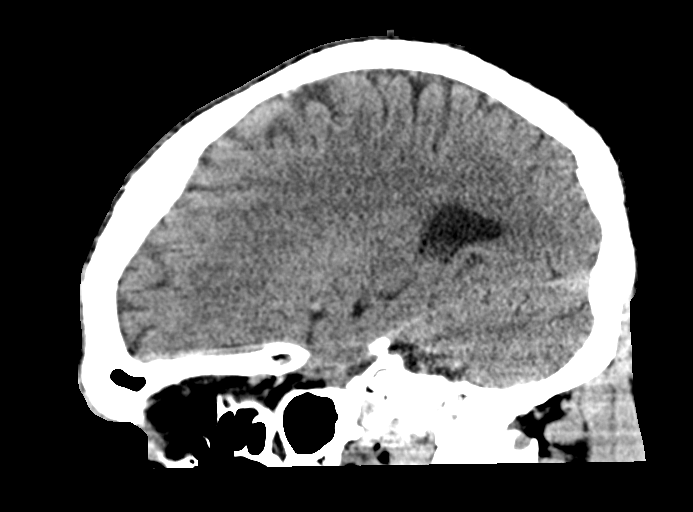

[16 of 47 positions shown; findings below may reference images not displayed]

FINDINGS: Brain: There is no mass, hemorrhage or extra-axial collection. The
size and configuration of the ventricles and extra-axial CSF spaces
are normal. The brain parenchyma is normal, without acute or chronic
infarction.

Vascular: No abnormal hyperdensity of the major intracranial
arteries or dural venous sinuses. No intracranial atherosclerosis.

Skull: The visualized skull base, calvarium and extracranial soft
tissues are normal.

Sinuses/Orbits: No fluid levels or advanced mucosal thickening of
the visualized paranasal sinuses. No mastoid or middle ear effusion.
The orbits are normal.
IMPRESSION: Normal head CT.

## 2023-04-23 ENCOUNTER — Emergency Department (HOSPITAL_COMMUNITY)
Admission: EM | Admit: 2023-04-23 | Discharge: 2023-04-23 | Attending: Emergency Medicine | Admitting: Emergency Medicine

## 2023-04-23 ENCOUNTER — Emergency Department (HOSPITAL_COMMUNITY)

## 2023-04-23 ENCOUNTER — Other Ambulatory Visit: Payer: Self-pay

## 2023-04-23 ENCOUNTER — Encounter (HOSPITAL_COMMUNITY): Payer: Self-pay | Admitting: Emergency Medicine

## 2023-04-23 DIAGNOSIS — H5712 Ocular pain, left eye: Secondary | ICD-10-CM | POA: Insufficient documentation

## 2023-04-23 DIAGNOSIS — Z23 Encounter for immunization: Secondary | ICD-10-CM | POA: Diagnosis not present

## 2023-04-23 DIAGNOSIS — Z79899 Other long term (current) drug therapy: Secondary | ICD-10-CM | POA: Diagnosis not present

## 2023-04-23 DIAGNOSIS — S0083XA Contusion of other part of head, initial encounter: Secondary | ICD-10-CM | POA: Diagnosis not present

## 2023-04-23 DIAGNOSIS — S0990XA Unspecified injury of head, initial encounter: Secondary | ICD-10-CM | POA: Diagnosis present

## 2023-04-23 DIAGNOSIS — M79641 Pain in right hand: Secondary | ICD-10-CM | POA: Insufficient documentation

## 2023-04-23 MED ORDER — NAPROXEN 250 MG PO TABS
500.0000 mg | ORAL_TABLET | Freq: Once | ORAL | Status: AC
  Administered 2023-04-23: 500 mg via ORAL
  Filled 2023-04-23: qty 2

## 2023-04-23 MED ORDER — TETANUS-DIPHTH-ACELL PERTUSSIS 5-2.5-18.5 LF-MCG/0.5 IM SUSY
0.5000 mL | PREFILLED_SYRINGE | Freq: Once | INTRAMUSCULAR | Status: AC
  Administered 2023-04-23: 0.5 mL via INTRAMUSCULAR
  Filled 2023-04-23: qty 0.5

## 2023-04-23 MED ORDER — NAPROXEN 375 MG PO TABS: 375.0000 mg | ORAL_TABLET | Freq: Two times a day (BID) | ORAL | 0 refills | Status: AC

## 2023-04-23 NOTE — Discharge Instructions (Addendum)
As discussed, your imaging does not show any fractures of either the left orbit or your right hand.  You most likely have a contusion.  You can take Naproxen twice a day as needed for pain and swelling. Wear an ACE wrap to help with swelling as well.   I have provided you with a referral to ophthalmology.  You can call their office to create an appointment for further evaluation of your left eye blurriness.  Get help right away if: You have very bad pain. Your hand or fingers are numb. Your hand or fingers turn: Very light (pale). Blue. Cold. You cannot move your hand or wrist. Your hand feels warm when you touch it.
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
